# Patient Record
Sex: Female | Born: 1960
Health system: Southern US, Community
[De-identification: ages and names within clinical notes are randomized; demographics above are authoritative.]

## PROBLEM LIST (undated history)

## (undated) DIAGNOSIS — IMO0002 Reserved for concepts with insufficient information to code with codable children: Secondary | ICD-10-CM

## (undated) HISTORY — DX: Reserved for concepts with insufficient information to code with codable children: IMO0002

## (undated) HISTORY — PX: AUGMENTATION MAMMAPLASTY: SUR837

---

## 1982-04-19 HISTORY — PX: OTHER SURGICAL HISTORY: SHX169

## 1986-04-19 HISTORY — PX: CORNEAL TRANSPLANT: SHX108

## 1992-04-19 HISTORY — PX: BREAST ENHANCEMENT SURGERY: SHX7

## 1992-04-19 HISTORY — PX: OTHER SURGICAL HISTORY: SHX169

## 2011-02-08 ENCOUNTER — Ambulatory Visit (HOSPITAL_BASED_OUTPATIENT_CLINIC_OR_DEPARTMENT_OTHER)
Admission: RE | Admit: 2011-02-08 | Discharge: 2011-02-08 | Disposition: A | Discharge status: Home / Self Care | Payer: 59 | Source: Ambulatory Visit | Attending: Internal Medicine | Admitting: Internal Medicine

## 2011-02-08 ENCOUNTER — Encounter: Payer: Self-pay | Admitting: Internal Medicine

## 2011-02-08 ENCOUNTER — Ambulatory Visit (INDEPENDENT_AMBULATORY_CARE_PROVIDER_SITE_OTHER)
Admission: RE | Admit: 2011-02-08 | Discharge: 2011-02-08 | Disposition: A | Discharge status: Home / Self Care | Payer: 59 | Source: Ambulatory Visit | Attending: Internal Medicine | Admitting: Internal Medicine

## 2011-02-08 ENCOUNTER — Ambulatory Visit (INDEPENDENT_AMBULATORY_CARE_PROVIDER_SITE_OTHER): Payer: 59 | Admitting: Internal Medicine

## 2011-02-08 DIAGNOSIS — H52209 Unspecified astigmatism, unspecified eye: Secondary | ICD-10-CM | POA: Insufficient documentation

## 2011-02-08 DIAGNOSIS — N83209 Unspecified ovarian cyst, unspecified side: Secondary | ICD-10-CM | POA: Insufficient documentation

## 2011-02-08 DIAGNOSIS — N949 Unspecified condition associated with female genital organs and menstrual cycle: Secondary | ICD-10-CM

## 2011-02-08 DIAGNOSIS — N920 Excessive and frequent menstruation with regular cycle: Secondary | ICD-10-CM

## 2011-02-08 DIAGNOSIS — Z139 Encounter for screening, unspecified: Secondary | ICD-10-CM

## 2011-02-08 DIAGNOSIS — D259 Leiomyoma of uterus, unspecified: Secondary | ICD-10-CM

## 2011-02-08 DIAGNOSIS — N938 Other specified abnormal uterine and vaginal bleeding: Secondary | ICD-10-CM | POA: Insufficient documentation

## 2011-02-08 DIAGNOSIS — N924 Excessive bleeding in the premenopausal period: Secondary | ICD-10-CM

## 2011-02-08 LAB — CBC WITH DIFFERENTIAL/PLATELET
Basophils Absolute: 0.1 10*3/uL (ref 0.0–0.1)
Eosinophils Relative: 2 % (ref 0–5)
HCT: 36.5 % (ref 36.0–46.0)
Hemoglobin: 10.9 g/dL — ABNORMAL LOW (ref 12.0–15.0)
Lymphocytes Relative: 29 % (ref 12–46)
Lymphs Abs: 1.8 10*3/uL (ref 0.7–4.0)
MCV: 81.5 fL (ref 78.0–100.0)
Monocytes Absolute: 0.3 10*3/uL (ref 0.1–1.0)
Monocytes Relative: 5 % (ref 3–12)
Neutro Abs: 4 10*3/uL (ref 1.7–7.7)
RBC: 4.48 MIL/uL (ref 3.87–5.11)
RDW: 16.2 % — ABNORMAL HIGH (ref 11.5–15.5)
WBC: 6.3 10*3/uL (ref 4.0–10.5)

## 2011-02-08 LAB — TSH: TSH: 2.596 u[IU]/mL (ref 0.350–4.500)

## 2011-02-08 NOTE — Patient Instructions (Signed)
To have ultrasound pelvic and transvaginal today  Labs will be mailed to you  I will call when I have results

## 2011-02-08 NOTE — Progress Notes (Signed)
Subjective:    Patient ID: Joanne Jones, female    DOB: Mar 19, 1961, 50 y.o.   MRN: [id number]  HPI  Joanne Jones is here as a new pt for first visit.  Former care Dr. Primitivo Gauze with [facility].  PMH of abnormal pap with positive HPV and pt reports "Cyst on cervix"  She had cryosurgery over 10 years ago and all pap smears have been normal since that time.  She also states in the distant past she was anemic.  She is overdue for her mammogram and is about 6 months overdue for a pap smear but she has some menstrual bleeding today  Joanne Jones reports she had had heavy menstrual bleeding with clotting  for the first few days of her menstrual cycle requiring her to change hourly on the first few days.  LMP 4 weeks ago but also began bleeding earlier.  She has not missed a cycle.   Rare night sweats that she does not attribute to vasomotor flushing.  No Known Allergies Past Medical History  Diagnosis Date  . 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 Astigmatism     S/P bialteral corneal transplants   Past Surgical History  Procedure Date  . Corneal transp;lant 84    left eye  . Corneal transplant 88    right eye  . Breast enhancement surgery 94  . Removal of lymphoma 2002   History   Social History  . Marital Status: Married    Spouse Name: N/A    Number of Children: N/A  . Years of Education: N/A   Occupational History  . Not on file.   Social History Main Topics  . Smoking status: Never Smoker   . Smokeless tobacco: Never Used  . Alcohol Use: 1.2 oz/week    2 Glasses of wine per week  . Drug Use: No  . Sexually Active: Yes -- Female partner(s)   Other Topics Concern  . Not on file   Social History Narrative  . No narrative on file   Family History  Problem Relation Age of Onset  . Cancer Maternal Grandmother     uterine  . Supraventricular tachycardia Mother   . Heart disease Father    Patient Active Problem List  Diagnoses  . Abnormal Pap  smear and cervical HPV (human papillomavirus)  . Astigmatism   No current outpatient prescriptions on file prior to visit.        Review of Systems    See HPI Objective:   Physical Exam  No Known Allergies Past Medical History  Diagnosis Date  . 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 Astigmatism     S/P bialteral corneal transplants   Past Surgical History  Procedure Date  . Corneal transp;lant 84    left eye  . Corneal transplant 88    right eye  . Breast enhancement surgery 94  . Removal of lymphoma 2002   History   Social History  . Marital Status: Married    Spouse Name: N/A    Number of Children: N/A  . Years of Education: N/A   Occupational History  . Not on file.   Social History Main Topics  . Smoking status: Never Smoker   . Smokeless tobacco: Never Used  . Alcohol Use: 1.2 oz/week    2 Glasses of wine per week  . Drug Use: No  . Sexually Active: Yes --  Female partner(s)   Other Topics Concern  . Not on file   Social History Narrative  . No narrative on file   Family History  Problem Relation Age of Onset  . Cancer Maternal Grandmother     uterine  . Supraventricular tachycardia Mother   . Heart disease Father    Patient Active Problem List  Diagnoses  . Abnormal Pap smear and cervical HPV (human papillomavirus)  . Astigmatism   No current outpatient prescriptions on file prior to visit.    Physical Exam  Nursing note and vitals reviewed.  Constitutional: She is oriented to person, place, and time. She appears well-developed and well-nourished.  HENT:  Head: Normocephalic and atraumatic. Neck No thyromegaly or nodules palpated Cardiovascular: Normal rate and regular rhythm. Exam reveals no gallop and no friction rub.  No murmur heard.  Pulmonary/Chest: Breath sounds normal. She has no wheezes. She has no rales.  Neurological: She is alert and oriented to person, place, and time.  Skin:  Skin is warm and dry.  Psychiatric: She has a normal mood and affect. Her behavior is normal.  Ext no edema Did not do pap/pelvic today as pt has menstrual bleeding       Assessment & Plan:  1)  Menorrhagia:  Suspect anovulatory cycles but will check TSH, CBC, chemistries and will obtain pelvic and TVUS today.  Further therapy based on results 2)  H/o anemia  See above 3)  H/O abnormal pap S/P cryosurgery 4)  Bilateral corneal transplants with astigmatism.  She wishes to follow up with opthalmologist is [place]  Will also get screening mammogram today

## 2011-02-09 ENCOUNTER — Telehealth: Payer: Self-pay | Admitting: Internal Medicine

## 2011-02-09 LAB — COMPREHENSIVE METABOLIC PANEL
AST: 18 U/L (ref 0–37)
Albumin: 4.8 g/dL (ref 3.5–5.2)
BUN: 14 mg/dL (ref 6–23)
CO2: 28 mEq/L (ref 19–32)
Calcium: 10.3 mg/dL (ref 8.4–10.5)
Chloride: 106 mEq/L (ref 96–112)
Creat: 0.79 mg/dL (ref 0.50–1.10)
Potassium: 4.2 mEq/L (ref 3.5–5.3)

## 2011-02-09 LAB — LIPID PANEL
LDL Cholesterol: 131 mg/dL — ABNORMAL HIGH (ref 0–99)
Triglycerides: 79 mg/dL (ref <150)

## 2011-02-09 NOTE — Telephone Encounter (Signed)
Spoke with pt and informed of anemia.  Will give INtegra samples to take one daily or if cannot tolerate daily take qod  Hass 5 mm endometrium,  Will refer to Dr. Jearld Lesch .  Pt voices understanding

## 2011-02-09 NOTE — Telephone Encounter (Signed)
Samples of Integra at front desk for Michelina to pick up.  Appointment scheduled with Dr. Penne Lash 02/17/11 @ 1045/ 1030 arrival- card placed with samples, will give to pt on pick up.  Labs printed and placed with samples and DASH diet

## 2011-02-17 ENCOUNTER — Encounter: Payer: Self-pay | Admitting: Obstetrics & Gynecology

## 2011-02-17 ENCOUNTER — Ambulatory Visit (INDEPENDENT_AMBULATORY_CARE_PROVIDER_SITE_OTHER): Payer: 59 | Admitting: Obstetrics & Gynecology

## 2011-02-17 VITALS — BP 136/78 | HR 65 | Resp 16 | Ht 64.0 in | Wt 130.0 lb

## 2011-02-17 DIAGNOSIS — N840 Polyp of corpus uteri: Secondary | ICD-10-CM

## 2011-02-17 DIAGNOSIS — N926 Irregular menstruation, unspecified: Secondary | ICD-10-CM

## 2011-02-17 DIAGNOSIS — N939 Abnormal uterine and vaginal bleeding, unspecified: Secondary | ICD-10-CM

## 2011-02-17 NOTE — Progress Notes (Signed)
  Subjective:    Patient ID: Joanne Jones, female    DOB: 05/05/1960, 50 y.o.   MRN: [id number]  HPI  50 yo P2 female c/o bleeding 2x month.  Menses lasting up to 9 days then stopping for 4-5 days then restarts bleeding.  Pt fam med hx significant for grandmother with endometrial cancer.  No pelvic pain, urinary or GI complaints.  This increased bleeding has been occurring for 1-1 1/2 yrs.  Review of Systems  Constitutional: Negative.   Respiratory: Negative.   Cardiovascular: Negative.   Gastrointestinal: Negative.   Genitourinary: Positive for vaginal bleeding.  Musculoskeletal: Negative.   Neurological: Negative.   Psychiatric/Behavioral: Negative.        Objective:   Physical Exam  Constitutional: She is oriented to person, place, and time. She appears well-developed and well-nourished.  HENT:  Head: Normocephalic and atraumatic.  Eyes: Conjunctivae are normal.  Pulmonary/Chest: Effort normal.  Abdominal: Soft. She exhibits no distension. There is no tenderness.  Genitourinary: Vagina normal and uterus normal.       Endometrial polyp seen and removed  Neurological: She is alert and oriented to person, place, and time.  Skin: Skin is warm and dry.  Psychiatric: She has a normal mood and affect.    UPT negative Patient given informed consent, signed copy in the chart, time out was performed. Appropriate time out taken. . The patient was placed in the lithotomy position and the cervix brought into view with sterile speculum.  Portio of cervix cleansed x 2 with betadine swabs.  Endometrial polyp seen and grasped.  Removed with twisting.  Good hemostasis.  The uterus was sounded for depth of 9. A pipelle was introduced to into the uterus, suction created,  and an endometrial sample was obtained. All equipment was removed and accounted for.  The patient tolerated the procedure well.    Patient given post procedure instructions. The patient will return in 2 weeks for results.        Assessment & Plan:  50 yo P2 female with irregular uterine bleeding.  1-endometrial polyp estruding from os--removed today 2-Us nml except fluid in endometrial canel; endometrial biopsy today 3-Needs pap smear 4-RTC in 2 weeks.

## 2011-02-17 NOTE — Patient Instructions (Signed)
Endometrial Biopsy This is a test in which a tissue sample (a biopsy) is taken from inside the uterus (womb). It is then looked at by a specialist under a microscope to see if the tissue is normal or abnormal. The endometrium is the lining of the uterus. This test helps determine where you are in your menstrual cycle and how hormone levels are affecting the lining of the uterus. Another use for this test is to diagnose endometrial cancer, tuberculosis, polyps, or inflammatory conditions and to evaluate uterine bleeding. PREPARATION FOR TEST No preparation or fasting is necessary. NORMAL FINDINGS No pathologic conditions. Presence of "secretory-type" endometrium 3 to 5 days before to normal menstruation. Ranges for normal findings may vary among different laboratories and hospitals. You should always check with your doctor after having lab work or other tests done to discuss the meaning of your test results and whether your values are considered within normal limits. MEANING OF TEST  Your caregiver will go over the test results with you and discuss the importance and meaning of your results, as well as treatment options and the need for additional tests if necessary. OBTAINING THE TEST RESULTS It is your responsibility to obtain your test results. Ask the lab or department performing the test when and how you will get your results. Document Released: 08/06/2004 Document Revised: 12/16/2010 Document Reviewed: 03/15/2008 [employer] Patient Information 2012 [employer]. 

## 2011-02-22 ENCOUNTER — Telehealth: Payer: Self-pay | Admitting: *Deleted

## 2011-02-22 NOTE — Telephone Encounter (Signed)
Pt returned call and given results of endometrial biopsy.  Appt scheduled with Dr Penne Lash on 03/03/11 @ 8:45

## 2011-03-03 ENCOUNTER — Encounter: Payer: Self-pay | Admitting: Obstetrics & Gynecology

## 2011-03-03 ENCOUNTER — Ambulatory Visit (INDEPENDENT_AMBULATORY_CARE_PROVIDER_SITE_OTHER): Payer: 59 | Admitting: Obstetrics & Gynecology

## 2011-03-03 DIAGNOSIS — Z01419 Encounter for gynecological examination (general) (routine) without abnormal findings: Secondary | ICD-10-CM

## 2011-03-03 DIAGNOSIS — Z1272 Encounter for screening for malignant neoplasm of vagina: Secondary | ICD-10-CM

## 2011-03-03 DIAGNOSIS — Z113 Encounter for screening for infections with a predominantly sexual mode of transmission: Secondary | ICD-10-CM

## 2011-03-03 DIAGNOSIS — N840 Polyp of corpus uteri: Secondary | ICD-10-CM

## 2011-03-03 NOTE — Patient Instructions (Signed)
Endometrial Ablation Endometrial ablation removes the lining of the uterus (endometrium). It is usually a same day, outpatient treatment. Ablation helps avoid major surgery (such as a hysterectomy). A hysterectomy is removal of the cervix and uterus. Endometrial ablation has less risk and complications, has a shorter recovery period and is less expensive. After endometrial ablation, most women will have little or no menstrual bleeding. You may not keep your fertility. Pregnancy is no longer likely after this procedure but if you are pre-menopausal, you still need to use a reliable method of birth control following the procedure because pregnancy can occur. REASONS TO HAVE THE PROCEDURE MAY INCLUDE:  Heavy periods.   Bleeding that is causing anemia.   Anovulatory bleeding, very irregular, bleeding.   Bleeding submucous fibroids (on the lining inside the uterus) if they are smaller than 3 centimeters.  REASONS NOT TO HAVE THE PROCEDURE MAY INCLUDE:  You wish to have more children.   You have a pre-cancerous or cancerous problem. The cause of any abnormal bleeding must be diagnosed before having the procedure.   You have pain coming from the uterus.   You have a submucus fibroid larger than 3 centimeters.   You recently had a baby.   You recently had an infection in the uterus.   You have a severe retro-flexed, tipped uterus and cannot insert the instrument to do the ablation.   You had a Cesarean section or deep major surgery on the uterus.   The inner cavity of the uterus is too large for the endometrial ablation instrument.  RISKS AND COMPLICATIONS   Perforation of the uterus.   Bleeding.   Infection of the uterus, bladder or vagina.   Injury to surrounding organs.   Cutting the cervix.   An air bubble to the lung (air embolus).   Pregnancy following the procedure.   Failure of the procedure to help the problem requiring hysterectomy.   Decreased ability to diagnose  cancer in the lining of the uterus.  BEFORE THE PROCEDURE  The lining of the uterus must be tested to make sure there is no pre-cancerous or cancer cells present.   Medications may be given to make the lining of the uterus thinner.   Ultrasound may be used to evaluate the size and look for abnormalities of the uterus.   Future pregnancy is not desired.  PROCEDURE  There are different ways to destroy the lining of the uterus.   Resectoscope - radio frequency-alternating electric current is the most common one used.   Cryotherapy - freezing the lining of the uterus.   Heated Free Liquid - heated salt (saline) solution inserted into the uterus.   Microwave - uses high energy microwaves in the uterus.   Thermal Balloon - a catheter with a balloon tip is inserted into the uterus and filled with heated fluid.  Your caregiver will talk with you about the method used in this clinic. They will also instruct you on the pros and cons of the procedure. Endometrial ablation is performed along with a procedure called operative hysteroscopy. A narrow viewing tube is inserted through the birth canal (vagina) and through the cervix into the uterus. A tiny camera attached to the viewing tube (hysteroscope) allows the uterine cavity to be shown on a TV monitor during surgery. Your uterus is filled with a harmless liquid to make the procedure easier. The lining of the uterus is then removed. The lining can also be removed with a resectoscope which allows your surgeon   to cut away the lining of the uterus under direct vision. Usually, you will be able to go home within an hour after the procedure. HOME CARE INSTRUCTIONS   Do not drive for 24 hours.   No tampons, douching or intercourse for 2 weeks or until your caregiver approves.   Rest at home for 24 to 48 hours. You may then resume normal activities unless told differently by your caregiver.   Take your temperature two times a day for 4 days, and record  it.   Take any medications your caregiver has ordered, as directed.   Use some form of contraception if you are pre-menopausal and do not want to get pregnant.  Bleeding after the procedure is normal. It varies from light spotting and mildly watery to bloody discharge for 4 to 6 weeks. You may also have mild cramping. Only take over-the-counter or prescription medicines for pain, discomfort, or fever as directed by your caregiver. Do not use aspirin, as this may aggravate bleeding. Frequent urination during the first 24 hours is normal. You will not know how effective your surgery is until at least 3 months after the surgery. SEEK IMMEDIATE MEDICAL CARE IF:   Bleeding is heavier than a normal menstrual cycle.   An oral temperature above 102 F (38.9 C) develops.   You have increasing cramps or pains not relieved with medication or develop belly (abdominal) pain which does not seem to be related to the same area of earlier cramping and pain.   You are light headed, weak or have fainting episodes.   You develop pain in the shoulder strap areas.   You have chest or leg pain.   You have abnormal vaginal discharge.   You have painful urination.  Document Released: 02/13/2004 Document Revised: 12/16/2010 Document Reviewed: 05/13/2007 [facility] Patient Information 2012 [place], [place].Endometrial Ablation Endometrial ablation removes the lining of the uterus (endometrium). It is usually a same day, outpatient treatment. Ablation helps avoid major surgery (such as a hysterectomy). A hysterectomy is removal of the cervix and uterus. Endometrial ablation has less risk and complications, has a shorter recovery period and is less expensive. After endometrial ablation, most women will have little or no menstrual bleeding. You may not keep your fertility. Pregnancy is no longer likely after this procedure but if you are pre-menopausal, you still need to use a reliable method of birth control following the  procedure because pregnancy can occur. REASONS TO HAVE THE PROCEDURE MAY INCLUDE:  Heavy periods.   Bleeding that is causing anemia.   Anovulatory bleeding, very irregular, bleeding.   Bleeding submucous fibroids (on the lining inside the uterus) if they are smaller than 3 centimeters.  REASONS NOT TO HAVE THE PROCEDURE MAY INCLUDE:  You wish to have more children.   You have a pre-cancerous or cancerous problem. The cause of any abnormal bleeding must be diagnosed before having the procedure.   You have pain coming from the uterus.   You have a submucus fibroid larger than 3 centimeters.   You recently had a baby.   You recently had an infection in the uterus.   You have a severe retro-flexed, tipped uterus and cannot insert the instrument to do the ablation.   You had a Cesarean section or deep major surgery on the uterus.   The inner cavity of the uterus is too large for the endometrial ablation instrument.  RISKS AND COMPLICATIONS   Perforation of the uterus.   Bleeding.   Infection of the  uterus, bladder or vagina.   Injury to surrounding organs.   Cutting the cervix.   An air bubble to the lung (air embolus).   Pregnancy following the procedure.   Failure of the procedure to help the problem requiring hysterectomy.   Decreased ability to diagnose cancer in the lining of the uterus.  BEFORE THE PROCEDURE  The lining of the uterus must be tested to make sure there is no pre-cancerous or cancer cells present.   Medications may be given to make the lining of the uterus thinner.   Ultrasound may be used to evaluate the size and look for abnormalities of the uterus.   Future pregnancy is not desired.  PROCEDURE  There are different ways to destroy the lining of the uterus.   Resectoscope - radio frequency-alternating electric current is the most common one used.   Cryotherapy - freezing the lining of the uterus.   Heated Free Liquid - heated salt  (saline) solution inserted into the uterus.   Microwave - uses high energy microwaves in the uterus.   Thermal Balloon - a catheter with a balloon tip is inserted into the uterus and filled with heated fluid.  Your caregiver will talk with you about the method used in this clinic. They will also instruct you on the pros and cons of the procedure. Endometrial ablation is performed along with a procedure called operative hysteroscopy. A narrow viewing tube is inserted through the birth canal (vagina) and through the cervix into the uterus. A tiny camera attached to the viewing tube (hysteroscope) allows the uterine cavity to be shown on a TV monitor during surgery. Your uterus is filled with a harmless liquid to make the procedure easier. The lining of the uterus is then removed. The lining can also be removed with a resectoscope which allows your surgeon to cut away the lining of the uterus under direct vision. Usually, you will be able to go home within an hour after the procedure. HOME CARE INSTRUCTIONS   Do not drive for 24 hours.   No tampons, douching or intercourse for 2 weeks or until your caregiver approves.   Rest at home for 24 to 48 hours. You may then resume normal activities unless told differently by your caregiver.   Take your temperature two times a day for 4 days, and record it.   Take any medications your caregiver has ordered, as directed.   Use some form of contraception if you are pre-menopausal and do not want to get pregnant.  Bleeding after the procedure is normal. It varies from light spotting and mildly watery to bloody discharge for 4 to 6 weeks. You may also have mild cramping. Only take over-the-counter or prescription medicines for pain, discomfort, or fever as directed by your caregiver. Do not use aspirin, as this may aggravate bleeding. Frequent urination during the first 24 hours is normal. You will not know how effective your surgery is until at least 3 months  after the surgery. SEEK IMMEDIATE MEDICAL CARE IF:   Bleeding is heavier than a normal menstrual cycle.   An oral temperature above 102 F (38.9 C) develops.   You have increasing cramps or pains not relieved with medication or develop belly (abdominal) pain which does not seem to be related to the same area of earlier cramping and pain.   You are light headed, weak or have fainting episodes.   You develop pain in the shoulder strap areas.   You have chest or leg  pain.   You have abnormal vaginal discharge.   You have painful urination.  Document Released: 02/13/2004 Document Revised: 12/16/2010 Document Reviewed: 05/13/2007 [facility] Patient Information 2012 [place], [place].

## 2011-03-03 NOTE — Progress Notes (Signed)
  Subjective:    Patient ID: Joanne Jones, female    DOB: Aug 15, 1960, 50 y.o.   MRN: [id number]  HPI  Pt presents for follow up of biopsies.  Endocervcial polyp benign--pt states bleeding stopped since removal.  Endometrial polyp on biopsy--discussed removal of polyp and ablation for menorrhagia.  Review of Systems  Respiratory: Negative.   Cardiovascular: Negative.   Genitourinary: Negative.        Objective:   Physical Exam  Constitutional: She is oriented to person, place, and time. She appears well-developed and well-nourished. No distress.  HENT:  Head: Normocephalic and atraumatic.  Eyes: Conjunctivae are normal.  Abdominal: Soft. She exhibits no distension. There is no tenderness.  Genitourinary: Vagina normal and uterus normal.  Neurological: She is alert and oriented to person, place, and time.  Skin: Skin is warm and dry.  Psychiatric: She has a normal mood and affect.          Assessment & Plan:  50 yo female with endometrial polyp on biopsy  1-hysteroscopic removal of polyp +/- ablation; pt wants to think about the procedure for now 2-pap done today 3-Pt to call for appt early next year when she is ready for surgery.

## 2011-03-17 ENCOUNTER — Other Ambulatory Visit (INDEPENDENT_AMBULATORY_CARE_PROVIDER_SITE_OTHER): Payer: 59 | Admitting: Emergency Medicine

## 2011-03-17 ENCOUNTER — Telehealth: Payer: Self-pay | Admitting: *Deleted

## 2011-03-17 ENCOUNTER — Telehealth: Payer: Self-pay | Admitting: Internal Medicine

## 2011-03-17 DIAGNOSIS — N390 Urinary tract infection, site not specified: Secondary | ICD-10-CM

## 2011-03-17 DIAGNOSIS — R300 Dysuria: Secondary | ICD-10-CM

## 2011-03-17 LAB — POCT URINALYSIS DIPSTICK
Bilirubin, UA: NEGATIVE
Glucose, UA: NEGATIVE
Ketones, UA: NEGATIVE
Spec Grav, UA: <=1.005
Urobilinogen, UA: NEGATIVE

## 2011-03-17 MED ORDER — CIPROFLOXACIN HCL 500 MG PO TABS: 500.0000 mg | ORAL_TABLET | Freq: Two times a day (BID) | ORAL | Status: AC

## 2011-03-17 NOTE — Telephone Encounter (Signed)
Pt notified of Ascus pap with neg HPV no further testing needed will just repeat pap in 1 year.

## 2011-03-17 NOTE — Telephone Encounter (Signed)
Joanne Jones aware abx called to pharmacy

## 2011-03-17 NOTE — Telephone Encounter (Signed)
See U/A  consistant with UTI  Will treat with 5 day course of Cipro bid   [name] Please call [name] and let her know I will treat her for 5 days.  Call if not better

## 2011-03-19 LAB — CULTURE, URINE COMPREHENSIVE: Colony Count: 45000

## 2011-06-22 ENCOUNTER — Other Ambulatory Visit (HOSPITAL_COMMUNITY): Payer: Self-pay | Admitting: Psychiatry

## 2012-04-19 LAB — HM COLONOSCOPY

## 2012-10-24 ENCOUNTER — Other Ambulatory Visit: Payer: Self-pay | Admitting: *Deleted

## 2012-10-24 DIAGNOSIS — Z139 Encounter for screening, unspecified: Secondary | ICD-10-CM

## 2012-10-24 DIAGNOSIS — Z1231 Encounter for screening mammogram for malignant neoplasm of breast: Secondary | ICD-10-CM

## 2012-11-24 ENCOUNTER — Ambulatory Visit
Admission: RE | Admit: 2012-11-24 | Discharge: 2012-11-24 | Disposition: A | Discharge status: Home / Self Care | Payer: 59 | Source: Ambulatory Visit | Attending: Internal Medicine | Admitting: Internal Medicine

## 2012-11-24 DIAGNOSIS — Z1231 Encounter for screening mammogram for malignant neoplasm of breast: Secondary | ICD-10-CM

## 2012-11-28 ENCOUNTER — Other Ambulatory Visit: Payer: Self-pay | Admitting: *Deleted

## 2012-11-28 DIAGNOSIS — Z139 Encounter for screening, unspecified: Secondary | ICD-10-CM

## 2012-12-04 ENCOUNTER — Other Ambulatory Visit: Payer: Self-pay | Admitting: *Deleted

## 2012-12-04 DIAGNOSIS — Z139 Encounter for screening, unspecified: Secondary | ICD-10-CM

## 2012-12-04 DIAGNOSIS — N926 Irregular menstruation, unspecified: Secondary | ICD-10-CM

## 2013-01-25 ENCOUNTER — Encounter: Payer: 59 | Admitting: Internal Medicine

## 2013-01-29 LAB — CBC WITH DIFFERENTIAL/PLATELET
Eosinophils Absolute: 0.1 10*3/uL (ref 0.0–0.7)
Lymphs Abs: 2.2 10*3/uL (ref 0.7–4.0)
MCH: 28.5 pg (ref 26.0–34.0)
Neutro Abs: 3.9 10*3/uL (ref 1.7–7.7)
Neutrophils Relative %: 58 % (ref 43–77)
Platelets: 271 10*3/uL (ref 150–400)
RBC: 4.35 MIL/uL (ref 3.87–5.11)
WBC: 6.6 10*3/uL (ref 4.0–10.5)

## 2013-01-29 LAB — COMPREHENSIVE METABOLIC PANEL
ALT: 15 U/L (ref 0–35)
Alkaline Phosphatase: 57 U/L (ref 39–117)
CO2: 30 mEq/L (ref 19–32)
Sodium: 139 mEq/L (ref 135–145)
Total Bilirubin: 0.9 mg/dL (ref 0.3–1.2)
Total Protein: 6.7 g/dL (ref 6.0–8.3)

## 2013-01-29 LAB — LIPID PANEL
HDL: 65 mg/dL (ref >39)
LDL Cholesterol: 109 mg/dL — ABNORMAL HIGH (ref 0–99)
Total CHOL/HDL Ratio: 2.9 Ratio

## 2013-01-30 LAB — VITAMIN D 25 HYDROXY (VIT D DEFICIENCY, FRACTURES): Vit D, 25-Hydroxy: 62 ng/mL (ref 30–89)

## 2013-02-05 ENCOUNTER — Encounter: Payer: Self-pay | Admitting: *Deleted

## 2013-02-28 DIAGNOSIS — H18613 Keratoconus, stable, bilateral: Secondary | ICD-10-CM | POA: Insufficient documentation

## 2013-02-28 DIAGNOSIS — H40003 Preglaucoma, unspecified, bilateral: Secondary | ICD-10-CM | POA: Insufficient documentation

## 2013-03-21 ENCOUNTER — Encounter: Payer: Self-pay | Admitting: Internal Medicine

## 2013-03-21 ENCOUNTER — Ambulatory Visit (INDEPENDENT_AMBULATORY_CARE_PROVIDER_SITE_OTHER): Payer: 59 | Admitting: Internal Medicine

## 2013-03-21 VITALS — BP 97/59 | HR 67 | Temp 97.6°F | Resp 18 | Wt 134.0 lb

## 2013-03-21 DIAGNOSIS — R6889 Other general symptoms and signs: Secondary | ICD-10-CM

## 2013-03-21 DIAGNOSIS — Z1151 Encounter for screening for human papillomavirus (HPV): Secondary | ICD-10-CM

## 2013-03-21 DIAGNOSIS — Z947 Corneal transplant status: Secondary | ICD-10-CM

## 2013-03-21 DIAGNOSIS — Z0000 Encounter for general adult medical examination without abnormal findings: Secondary | ICD-10-CM

## 2013-03-21 DIAGNOSIS — Z124 Encounter for screening for malignant neoplasm of cervix: Secondary | ICD-10-CM

## 2013-03-21 LAB — HEMOCCULT GUIAC POC 1CARD (OFFICE): Fecal Occult Blood, POC: NEGATIVE

## 2013-03-21 LAB — POCT URINALYSIS DIPSTICK
Bilirubin, UA: NEGATIVE
Blood, UA: NEGATIVE
Glucose, UA: NEGATIVE
Ketones, UA: NEGATIVE
Leukocytes, UA: NEGATIVE
Nitrite, UA: NEGATIVE
Protein, UA: NEGATIVE
Spec Grav, UA: 1.015
Urobilinogen, UA: NEGATIVE
pH, UA: 7

## 2013-03-21 NOTE — Patient Instructions (Signed)
Will refer to DR Loreta Ave for first screeening colonoscopy  Activate my chart    See me as needed

## 2013-03-21 NOTE — Progress Notes (Signed)
Subjective:    Patient ID: Joanne Jones, female    DOB: 1960/08/08, 52 y.o.   MRN: [id number]  HPI  Joanne Jones is here for CPE.  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   Upon reviewing chart,  She had prior eval for DUB in 2012.  She had removal of endocervical polyp (benign path ) and endometrial polyp (also benign) found with biopsy.  Pt had  Discussion regarding complete excision of endometrial polyp with her GYN at that time and opted to wait .  Her menorrhagia had ceased.    Pap 2012 ASCUS HPV neg.   She has not had a pap since then .  She denies vaginal spotting or pelvic pain.  FH sig for grandmother with endometrial cancer.   MM done with implant views 10/2012 negative.  She is UTD with vacccines.  She has never had a colonoscopy.  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   She has  A mole behind her R shoulder that seems to be enlarging.    Not changing in color, no bleeding or itching    Review of Systems  Respiratory: Negative for choking, chest tightness and shortness of breath.   Cardiovascular: Negative for chest pain, palpitations and leg swelling.  Musculoskeletal: Negative for arthralgias.  All other systems reviewed and are negative.       Objective:   Physical Exam Physical Exam  Vital signs and nursing note reviewed  Constitutional: She is oriented to person, place, and time. She appears well-developed and well-nourished. She is cooperative.  HENT:  Head: Normocephalic and atraumatic.  Right Ear: Tympanic membrane normal.  Left Ear: Tympanic membrane normal.  Nose: Nose normal.  Mouth/Throat: Oropharynx is clear and moist and mucous membranes are normal. No oropharyngeal exudate or posterior oropharyngeal erythema.  Eyes: Conjunctivae and EOM are normal. Pupils are equal, round, and reactive to light.   Unable to visualize fundi well  Neck: Neck supple. No JVD present. Carotid bruit is not present. No mass  and no thyromegaly present.  Cardiovascular: Regular rhythm, normal heart sounds, intact distal pulses and normal pulses.  Exam reveals no gallop and no friction rub.   No murmur heard. Pulses:      Dorsalis pedis pulses are 2+ on the right side, and 2+ on the left side.  Pulmonary/Chest: Breath sounds normal. She has no wheezes. She has no rhonchi. She has no rales. Right breast exhibits no mass, no nipple discharge and no skin change. Left breast exhibits no mass, no nipple discharge and no skin change.  Abdominal: Soft. Bowel sounds are normal. She exhibits no distension and no mass. There is no hepatosplenomegaly. There is no tenderness. There is no CVA tenderness.  Genitourinary: Rectum normal, vagina normal and uterus normal. Rectal exam shows no mass. Guaiac negative stool. No labial fusion. There is no lesion on the right labia. There is no lesion on the left labia. Cervix exhibits no motion tenderness. Right adnexum displays no mass, no tenderness and no fullness. Left adnexum displays no mass, no tenderness and no fullness. No erythema around the vagina.  Musculoskeletal:       No active synovitis to any joint.    Lymphadenopathy:       Right cervical: No superficial cervical adenopathy present.      Left cervical: No superficial cervical adenopathy present.       Right axillary: No pectoral and no lateral adenopathy  present.       Left axillary: No pectoral and no lateral adenopathy present.      Right: No inguinal adenopathy present.       Left: No inguinal adenopathy present.  Neurological: She is alert and oriented to person, place, and time. She has normal strength and normal reflexes. No cranial nerve deficit or sensory deficit. She displays a negative Romberg sign. Coordination and gait normal.  Skin: Skin is warm and dry. No abrasion, no bruising, no ecchymosis and no rash noted. No cyanosis. Nails show no clubbing.  She has brown nevus behind R shoulder  No  discoloration Psychiatric: She has a normal mood and affect. Her speech is normal and behavior is normal.          Assessment & Plan:  Health maintenance  Pap today.  UTD with mm and vaccines  Will refer to GI for colonoscopy.  ASCUS/HPV neg on pap 2012:  Further management based on results  Endometrial polyp  Skin nevus - no worrsome features on exam  Pt wishes to make appt with dermatologist herself.  Will give number to Dr. Luvenia Starch practice  Bialteral corneal transplants

## 2013-03-22 ENCOUNTER — Telehealth: Payer: Self-pay | Admitting: *Deleted

## 2013-03-22 DIAGNOSIS — IMO0002 Reserved for concepts with insufficient information to code with codable children: Secondary | ICD-10-CM | POA: Insufficient documentation

## 2013-03-22 NOTE — Telephone Encounter (Signed)
Left dr Dellia Nims number on VM for pt

## 2013-03-27 ENCOUNTER — Telehealth: Payer: Self-pay | Admitting: Internal Medicine

## 2013-03-27 ENCOUNTER — Encounter: Payer: Self-pay | Admitting: Internal Medicine

## 2013-03-27 NOTE — Telephone Encounter (Signed)
Spoke with pt and informed of ASCUS result.  HPV negative  Will refer back to Dr. Penne Lash who she has seen in the past

## 2013-03-29 ENCOUNTER — Telehealth: Payer: Self-pay | Admitting: Internal Medicine

## 2013-03-29 NOTE — Telephone Encounter (Signed)
Will refer to GYN

## 2013-04-03 ENCOUNTER — Ambulatory Visit: Payer: 59 | Admitting: Obstetrics & Gynecology

## 2013-04-17 ENCOUNTER — Ambulatory Visit (INDEPENDENT_AMBULATORY_CARE_PROVIDER_SITE_OTHER): Payer: 59 | Admitting: Obstetrics & Gynecology

## 2013-04-17 ENCOUNTER — Encounter: Payer: Self-pay | Admitting: Obstetrics & Gynecology

## 2013-04-17 VITALS — BP 98/60 | HR 75 | Resp 16 | Ht 64.0 in | Wt 133.0 lb

## 2013-04-17 DIAGNOSIS — R6889 Other general symptoms and signs: Secondary | ICD-10-CM

## 2013-04-17 DIAGNOSIS — N840 Polyp of corpus uteri: Secondary | ICD-10-CM

## 2013-04-17 NOTE — Progress Notes (Signed)
Pt presents for review of pap smear findings.  Pt had an ASCUS pap smear with HPV negative.  Her next pap smear in 2014 showed ASCUS with HPV negative as well.  Pt is doing through perimenopause and findins could be attributed to atrophy of the tissues.  Per ASCCP guidelines, pt does not ned further work up.  Co teting in 3 years.    Reviewed bleeding profile.  Pt had menstruation in October 2014 and before that was earl 2014.  Pt does not have spotting or heavy bleeding.  Pt had an endocervical polyp removed and had an endometrial biopsy done.  The endocervical polyp was benign and the endometrial polyp showed breakdown.  TV US showed a 5 mm lining in 2012.  WE had talked about ablation but she decided against it.  ?Her bleeding was no longer an issue after removal of the endocervical polyp.    Pt encouraged to intake 1200 mg of calcium a day with Vitamin D.  Discussed atrophic vaginitis and perimenopausal expectations.

## 2013-07-07 ENCOUNTER — Encounter: Payer: Self-pay | Admitting: Internal Medicine

## 2013-07-07 DIAGNOSIS — K635 Polyp of colon: Secondary | ICD-10-CM | POA: Insufficient documentation

## 2013-07-07 DIAGNOSIS — K5790 Diverticulosis of intestine, part unspecified, without perforation or abscess without bleeding: Secondary | ICD-10-CM | POA: Insufficient documentation

## 2013-07-15 ENCOUNTER — Encounter: Payer: Self-pay | Admitting: Internal Medicine

## 2013-07-26 ENCOUNTER — Encounter: Payer: Self-pay | Admitting: *Deleted

## 2014-02-12 ENCOUNTER — Ambulatory Visit: Payer: Self-pay | Admitting: Internal Medicine

## 2014-04-09 ENCOUNTER — Other Ambulatory Visit: Payer: Self-pay | Admitting: *Deleted

## 2014-04-09 DIAGNOSIS — Z0000 Encounter for general adult medical examination without abnormal findings: Secondary | ICD-10-CM

## 2014-04-10 ENCOUNTER — Ambulatory Visit (INDEPENDENT_AMBULATORY_CARE_PROVIDER_SITE_OTHER): Payer: 59 | Admitting: Internal Medicine

## 2014-04-10 ENCOUNTER — Encounter: Payer: Self-pay | Admitting: *Deleted

## 2014-04-10 ENCOUNTER — Other Ambulatory Visit: Payer: Self-pay | Admitting: Internal Medicine

## 2014-04-10 ENCOUNTER — Encounter: Payer: Self-pay | Admitting: Internal Medicine

## 2014-04-10 VITALS — BP 101/62 | HR 72 | Resp 16 | Ht 64.5 in | Wt 139.0 lb

## 2014-04-10 DIAGNOSIS — K635 Polyp of colon: Secondary | ICD-10-CM

## 2014-04-10 DIAGNOSIS — Z0000 Encounter for general adult medical examination without abnormal findings: Secondary | ICD-10-CM

## 2014-04-10 DIAGNOSIS — Z1231 Encounter for screening mammogram for malignant neoplasm of breast: Secondary | ICD-10-CM

## 2014-04-10 DIAGNOSIS — K5730 Diverticulosis of large intestine without perforation or abscess without bleeding: Secondary | ICD-10-CM

## 2014-04-10 DIAGNOSIS — Z1211 Encounter for screening for malignant neoplasm of colon: Secondary | ICD-10-CM

## 2014-04-10 DIAGNOSIS — IMO0002 Reserved for concepts with insufficient information to code with codable children: Secondary | ICD-10-CM

## 2014-04-10 DIAGNOSIS — R896 Abnormal cytological findings in specimens from other organs, systems and tissues: Secondary | ICD-10-CM

## 2014-04-10 DIAGNOSIS — N840 Polyp of corpus uteri: Secondary | ICD-10-CM

## 2014-04-10 LAB — COMPLETE METABOLIC PANEL WITH GFR
ALK PHOS: 46 U/L (ref 39–117)
ALT: 12 U/L (ref 0–35)
AST: 20 U/L (ref 0–37)
Albumin: 4.5 g/dL (ref 3.5–5.2)
BUN: 14 mg/dL (ref 6–23)
CO2: 25 mEq/L (ref 19–32)
CREATININE: 0.85 mg/dL (ref 0.50–1.10)
Calcium: 9.7 mg/dL (ref 8.4–10.5)
Chloride: 104 mEq/L (ref 96–112)
GFR, Est Non African American: 78 mL/min
Glucose, Bld: 86 mg/dL (ref 70–99)
Potassium: 4.4 mEq/L (ref 3.5–5.3)
Sodium: 139 mEq/L (ref 135–145)
TOTAL PROTEIN: 6.6 g/dL (ref 6.0–8.3)
Total Bilirubin: 0.6 mg/dL (ref 0.2–1.2)

## 2014-04-10 LAB — HEMOCCULT GUIAC POC 1CARD (OFFICE)
Card #1 Date: 12232015
FECAL OCCULT BLD: NEGATIVE

## 2014-04-10 LAB — POCT URINALYSIS DIPSTICK
Bilirubin, UA: NEGATIVE
Blood, UA: NEGATIVE
Glucose, UA: NEGATIVE
KETONES UA: NEGATIVE
Leukocytes, UA: NEGATIVE
Nitrite, UA: NEGATIVE
PH UA: 6.5
PROTEIN UA: NEGATIVE
SPEC GRAV UA: 1.02
Urobilinogen, UA: NEGATIVE

## 2014-04-10 LAB — CBC WITH DIFFERENTIAL/PLATELET
Basophils Absolute: 0.1 10*3/uL (ref 0.0–0.1)
Basophils Relative: 1 % (ref 0–1)
Eosinophils Absolute: 0.1 10*3/uL (ref 0.0–0.7)
Eosinophils Relative: 2 % (ref 0–5)
HEMATOCRIT: 38.8 % (ref 36.0–46.0)
HEMOGLOBIN: 12.7 g/dL (ref 12.0–15.0)
LYMPHS ABS: 2.2 10*3/uL (ref 0.7–4.0)
LYMPHS PCT: 34 % (ref 12–46)
MCH: 29.3 pg (ref 26.0–34.0)
MCHC: 32.7 g/dL (ref 30.0–36.0)
MCV: 89.4 fL (ref 78.0–100.0)
MPV: 10.4 fL (ref 9.4–12.4)
Monocytes Absolute: 0.4 10*3/uL (ref 0.1–1.0)
Monocytes Relative: 6 % (ref 3–12)
Neutro Abs: 3.6 10*3/uL (ref 1.7–7.7)
Neutrophils Relative %: 57 % (ref 43–77)
Platelets: 302 10*3/uL (ref 150–400)
RBC: 4.34 MIL/uL (ref 3.87–5.11)
RDW: 14 % (ref 11.5–15.5)
WBC: 6.4 10*3/uL (ref 4.0–10.5)

## 2014-04-10 LAB — LIPID PANEL
CHOL/HDL RATIO: 3.7 ratio
Cholesterol: 211 mg/dL — ABNORMAL HIGH (ref 0–200)
HDL: 57 mg/dL (ref >39)
LDL Cholesterol: 139 mg/dL — ABNORMAL HIGH (ref 0–99)
Triglycerides: 76 mg/dL (ref <150)
VLDL: 15 mg/dL (ref 0–40)

## 2014-04-10 NOTE — Progress Notes (Signed)
Subjective:    Patient ID: Joanne Jones, female    DOB: January 22, 1961, 53 y.o.   MRN: [id number]  HPI GYN note  03/2013 Pt presents for review of pap smear findings. Pt had an ASCUS pap smear with HPV negative. Her next pap smear in 2014 showed ASCUS with HPV negative as well. Pt is doing through perimenopause and findins could be attributed to atrophy of the tissues. Per ASCCP guidelines, pt does not ned further work up. Co teting in 3 years.   Reviewed bleeding profile. Pt had menstruation in October 2014 and before that was earl 2014. Pt does not have spotting or heavy bleeding. Pt had an endocervical polyp removed and had an endometrial biopsy done. The endocervical polyp was benign and the endometrial polyp showed breakdown. TV US showed a 5 mm lining in 2012. WE had talked about ablation but she decided against it. ?Her bleeding was no longer an issue after removal of the endocervical polyp.   Pt encouraged to intake 1200 mg of calcium a day with Vitamin D.  Discussed atrophic vaginitis and perimenopausal expectations.   TODAY  [name] is here for CPE:  Working as Scientist, physiological for [employer]   HM:  She is due for mm,  colonsocopy done 2014,  Non-smoker  Pap done 2014   ASCUS with neg HPV  See Dr. Roseanne Kaufman note  Ok for q3year screening    No Known Allergies Past Medical History  Diagnosis Date  . 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 Astigmatism     S/P bialteral corneal transplants   Past Surgical History  Procedure Laterality Date  . Corneal transp;lant  84    left eye  . Corneal transplant  88    right eye  . Breast enhancement surgery  94  . Removal of lymphoma  2002    incorrect procedure.  . Removal of vulvar lipoma     History   Social History  . Marital Status: Married    Spouse Name: N/A    Number of Children: N/A  . Years of Education: N/A   Occupational History  . Not on file.   Social History Main  Topics  . Smoking status: Never Smoker   . Smokeless tobacco: Never Used  . Alcohol Use: 1.2 oz/week    2 Glasses of wine per week  . Drug Use: No  . Sexual Activity:    Partners: Male   Other Topics Concern  . Not on file   Social History Narrative  . No narrative on file   Family History  Problem Relation Age of Onset  . Cancer Maternal Grandmother     uterine  . Supraventricular tachycardia Mother   . Heart disease Father    Patient Active Problem List   Diagnosis Date Noted  . Colon polyp 07/07/2013  . Diverticulosis 07/07/2013  . ASCUS (atypical squamous cells of undetermined significance) on Pap smear 03/22/2013  . Post corneal transplant 03/21/2013  . Endometrial polyp 03/03/2011  . Astigmatism    Current Outpatient Prescriptions on File Prior to Visit  Medication Sig Dispense Refill  . ibuprofen (ADVIL,MOTRIN) 200 MG tablet Take 200 mg by mouth as needed.      . Multiple Vitamin (MULTIVITAMIN) tablet Take 1 tablet by mouth daily.       No current facility-administered medications on file prior to visit.      Review of Systems See HPI    Objective:  Physical Exam Physical Exam  Nursing note and vitals reviewed.  Constitutional: She is oriented to person, place, and time. She appears well-developed and well-nourished.  HENT:  Head: Normocephalic and atraumatic.  Right Ear: Tympanic membrane and ear canal normal. No drainage. Tympanic membrane is not injected and not erythematous.  Left Ear: Tympanic membrane and ear canal normal. No drainage. Tympanic membrane is not injected and not erythematous.  Nose: Nose normal. Right sinus exhibits no maxillary sinus tenderness and no frontal sinus tenderness. Left sinus exhibits no maxillary sinus tenderness and no frontal sinus tenderness.  Mouth/Throat: Oropharynx is clear and moist. No oral lesions. No oropharyngeal exudate.  Eyes: Conjunctivae and EOM are normal. Pupils are equal, round, and reactive to light.    Neck: Normal range of motion. Neck supple. No JVD present. Carotid bruit is not present. No mass and no thyromegaly present.  Cardiovascular: Normal rate, regular rhythm, S1 normal, S2 normal and intact distal pulses. Exam reveals no gallop and no friction rub.  No murmur heard.  Pulses:  Carotid pulses are 2+ on the right side, and 2+ on the left side.  Dorsalis pedis pulses are 2+ on the right side, and 2+ on the left side.  No carotid bruit. No LE edema  Pulmonary/Chest: Breath sounds normal. She has no wheezes. She has no rales. She exhibits no tenderness.  Breasts bilateral implants no discrete mass no nipple discharge no axillary adenopathy  Abdominal: Soft. Bowel sounds are normal. She exhibits no distension and no mass. There is no hepatosplenomegaly. There is no tenderness. There is no CVA tenderness.   Rectal no mass guaiac neg  No active synovitis to joints.  Lymphadenopathy:  She has no cervical adenopathy.  She has no axillary adenopathy.  Right: No inguinal and no supraclavicular adenopathy present.  Left: No inguinal and no supraclavicular adenopathy present.  Neurological: She is alert and oriented to person, place, and time. She has normal strength and normal reflexes. She displays no tremor. No cranial nerve deficit or sensory deficit. Coordination and gait normal.  Skin: Skin is warm and dry. No rash noted. No cyanosis. Nails show no clubbing.  Psychiatric: She has a normal mood and affect. Her speech is normal and behavior is normal. Cognition and memory are normal.        Assessment & Plan:  HM  Will schedule 3D mm  Pt non smoker  Due for pap in 2017  ASCUS  See GYN note above  Colon polyp  Diverticulosis    See me as needed

## 2014-04-10 NOTE — Patient Instructions (Signed)
See me as needed 

## 2014-04-11 LAB — VITAMIN D 25 HYDROXY (VIT D DEFICIENCY, FRACTURES): Vit D, 25-Hydroxy: 40 ng/mL (ref 30–100)

## 2014-04-11 LAB — TSH: TSH: 2.635 u[IU]/mL (ref 0.350–4.500)

## 2014-04-16 ENCOUNTER — Encounter: Payer: Self-pay | Admitting: Internal Medicine

## 2014-04-22 NOTE — Progress Notes (Signed)
Mailed a copy of labs and DASH diet to patient- eh

## 2014-04-25 ENCOUNTER — Ambulatory Visit
Admission: RE | Admit: 2014-04-25 | Discharge: 2014-04-25 | Disposition: A | Discharge status: Home / Self Care | Payer: 59 | Source: Ambulatory Visit | Attending: Internal Medicine | Admitting: Internal Medicine

## 2014-04-25 DIAGNOSIS — Z1231 Encounter for screening mammogram for malignant neoplasm of breast: Secondary | ICD-10-CM

## 2014-04-28 ENCOUNTER — Encounter: Payer: Self-pay | Admitting: Internal Medicine

## 2014-12-24 DIAGNOSIS — H01009 Unspecified blepharitis unspecified eye, unspecified eyelid: Secondary | ICD-10-CM | POA: Insufficient documentation

## 2014-12-24 DIAGNOSIS — H04121 Dry eye syndrome of right lacrimal gland: Secondary | ICD-10-CM | POA: Insufficient documentation

## 2015-05-22 ENCOUNTER — Telehealth: Payer: Self-pay | Admitting: *Deleted

## 2015-05-22 NOTE — Telephone Encounter (Signed)
Unable to reach patient at time of pre-visit call. Left message for patient to return call when available.  

## 2015-05-23 ENCOUNTER — Ambulatory Visit (INDEPENDENT_AMBULATORY_CARE_PROVIDER_SITE_OTHER): Payer: 59 | Admitting: Family

## 2015-05-23 ENCOUNTER — Encounter: Payer: Self-pay | Admitting: Family

## 2015-05-23 ENCOUNTER — Other Ambulatory Visit: Payer: Self-pay | Admitting: Family

## 2015-05-23 VITALS — BP 104/74 | HR 65 | Temp 97.9°F | Resp 14 | Ht 64.25 in | Wt 138.6 lb

## 2015-05-23 DIAGNOSIS — Z0000 Encounter for general adult medical examination without abnormal findings: Secondary | ICD-10-CM

## 2015-05-23 DIAGNOSIS — Z1231 Encounter for screening mammogram for malignant neoplasm of breast: Secondary | ICD-10-CM

## 2015-05-23 LAB — CBC WITH DIFFERENTIAL/PLATELET
BASOS ABS: 0 10*3/uL (ref 0.0–0.1)
Basophils Relative: 0.5 % (ref 0.0–3.0)
EOS PCT: 1.5 % (ref 0.0–5.0)
Eosinophils Absolute: 0.1 10*3/uL (ref 0.0–0.7)
HEMATOCRIT: 40.1 % (ref 36.0–46.0)
HEMOGLOBIN: 13.3 g/dL (ref 12.0–15.0)
LYMPHS ABS: 2.4 10*3/uL (ref 0.7–4.0)
LYMPHS PCT: 37.1 % (ref 12.0–46.0)
MCHC: 33.2 g/dL (ref 30.0–36.0)
MCV: 88.6 fl (ref 78.0–100.0)
MONOS PCT: 5.7 % (ref 3.0–12.0)
Monocytes Absolute: 0.4 10*3/uL (ref 0.1–1.0)
NEUTROS PCT: 55.2 % (ref 43.0–77.0)
Neutro Abs: 3.5 10*3/uL (ref 1.4–7.7)
Platelets: 292 10*3/uL (ref 150.0–400.0)
RBC: 4.53 Mil/uL (ref 3.87–5.11)
RDW: 13.6 % (ref 11.5–15.5)
WBC: 6.4 10*3/uL (ref 4.0–10.5)

## 2015-05-23 LAB — BASIC METABOLIC PANEL
BUN: 14 mg/dL (ref 6–23)
CALCIUM: 9.8 mg/dL (ref 8.4–10.5)
CO2: 31 mEq/L (ref 19–32)
Chloride: 104 mEq/L (ref 96–112)
Creatinine, Ser: 0.91 mg/dL (ref 0.40–1.20)
GFR: 68.15 mL/min (ref >60.00)
GLUCOSE: 95 mg/dL (ref 70–99)
POTASSIUM: 4.5 meq/L (ref 3.5–5.1)
SODIUM: 140 meq/L (ref 135–145)

## 2015-05-23 LAB — URINALYSIS, ROUTINE W REFLEX MICROSCOPIC
Bilirubin Urine: NEGATIVE
HGB URINE DIPSTICK: NEGATIVE
KETONES UR: NEGATIVE
LEUKOCYTES UA: NEGATIVE
Nitrite: NEGATIVE
RBC / HPF: NONE SEEN (ref 0–?)
TOTAL PROTEIN, URINE-UPE24: NEGATIVE
URINE GLUCOSE: NEGATIVE
Urobilinogen, UA: 0.2 (ref 0.0–1.0)
WBC, UA: NONE SEEN — AB (ref 0–?)
pH: 6.5 (ref 5.0–8.0)

## 2015-05-23 LAB — HEPATIC FUNCTION PANEL
ALBUMIN: 4.6 g/dL (ref 3.5–5.2)
ALK PHOS: 56 U/L (ref 39–117)
ALT: 13 U/L (ref 0–35)
AST: 20 U/L (ref 0–37)
Bilirubin, Direct: 0.1 mg/dL (ref 0.0–0.3)
Total Bilirubin: 0.8 mg/dL (ref 0.2–1.2)
Total Protein: 7.3 g/dL (ref 6.0–8.3)

## 2015-05-23 LAB — LIPID PANEL
CHOLESTEROL: 210 mg/dL — AB (ref 0–200)
HDL: 62.7 mg/dL (ref >39.00)
LDL Cholesterol: 137 mg/dL — ABNORMAL HIGH (ref 0–99)
NONHDL: 147.2
Total CHOL/HDL Ratio: 3
Triglycerides: 49 mg/dL (ref 0.0–149.0)
VLDL: 9.8 mg/dL (ref 0.0–40.0)

## 2015-05-23 LAB — TSH: TSH: 2.18 u[IU]/mL (ref 0.35–4.50)

## 2015-05-23 NOTE — Progress Notes (Signed)
Pre visit review using our clinic review tool, if applicable. No additional management support is needed unless otherwise documented below in the visit note. 

## 2015-05-23 NOTE — Progress Notes (Addendum)
Subjective:    Patient ID: Joanne Jones, female    DOB: 1960/06/28, 55 y.o.   MRN: [id number]  HPI  Joanne Jones is a 55 yr old female who presents today to establish care.  Patient presents today for complete physical.  Immunizations: flu shot up to date, Td 2009 Diet: overall healthy diet. Exercise: elliptical 40 min 4 x a week, Saturday walks 5 miles, some yoga.   Colonoscopy: 1/16 (Dr. Collene Mares), had 1 small polyp was told 10 yr follow up. Dexa: Has never had dexa.  LMP 12/16 Pap Smear:  10/15 Mammogram: due Dental: up to date  Hx abnormal pap. Saw Joanne Jones and was told Pap OK.   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     Review of Systems  Constitutional: Negative for unexpected weight change.  HENT: Negative for hearing loss and rhinorrhea.   Eyes: Negative for visual disturbance.  Respiratory: Negative for cough and shortness of breath.   Cardiovascular: Negative for chest pain and leg swelling.  Gastrointestinal: Negative for diarrhea, constipation and anal bleeding.  Genitourinary: Negative for dysuria and frequency.  Musculoskeletal: Negative for myalgias and arthralgias.  Skin: Negative for rash.  Neurological:       Occasional headaches  Hematological: Negative for adenopathy.  Psychiatric/Behavioral:       Denies depression/anxiety   Past Medical History  Diagnosis Date  . 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 Astigmatism     S/P bialteral corneal transplants    Social History   Social History  . Marital Status: Married    Spouse Name: N/A  . Number of Children: N/A  . Years of Education: N/A   Occupational History  . Not on file.   Social History Main Topics  . Smoking status: Never Smoker   . Smokeless tobacco: Never Used  . Alcohol Use: 8.4 oz/week    14 Glasses of wine per week  . Drug Use: No  . Sexual Activity:    Partners: Male   Other Topics Concern  . 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     Past Surgical History  Procedure Laterality Date  . Corneal transp;lant  84    left eye  . Corneal transplant  88    right eye  . Breast enhancement surgery  94  . Removal of vulvar lipoma  1994    Family History  Problem Relation Age of Onset  . Cancer Maternal Grandmother     uterine  . Supraventricular tachycardia Mother     living  . Heart disease Father 70    hx of MV disease, LAD lesion, died of stroke complication of surgery    No Known Allergies  No current outpatient prescriptions on file prior to visit.   No current facility-administered medications on file prior to visit.    BP 104/74 mmHg  Pulse 65  Temp(Src) 97.9 F (36.6 C) (Oral)  Resp 14  Ht 5' 4.25" (1.632 m)  Wt 138 lb 9.6 oz (62.869 kg)  BMI 23.60 kg/m2  SpO2 100%  LMP 03/20/2015        Objective:   Physical Exam  Physical Exam  Constitutional: She is oriented to person, place,  and time. She appears well-developed and well-nourished. No distress.  HENT:  Head: Normocephalic and atraumatic.  Right Ear: Tympanic membrane and ear canal normal.  Left Ear: Tympanic membrane and ear canal normal.  Mouth/Throat: Oropharynx is clear and moist.  Eyes: Pupils are equal, round, and reactive to light. No scleral icterus.  Neck: Normal range of motion. No thyromegaly present.  Cardiovascular: Normal rate and regular rhythm.   No murmur heard. Pulmonary/Chest: Effort normal and breath sounds normal. No respiratory distress. He has no wheezes. She has no rales. She exhibits no tenderness.  Abdominal: Soft. Bowel sounds are normal. He exhibits no distension and no mass. There is no tenderness. There is no rebound and no guarding.  Musculoskeletal: She exhibits no edema.  Lymphadenopathy:    She has no  cervical adenopathy.  Neurological: She is alert and oriented to person, place, and time. She has normal patellar reflexes. She exhibits normal muscle tone. Coordination normal.  Skin: Skin is warm and dry. left posterior shoulder-benign appearing raised skin keratosis approx 3 mm width Psychiatric: She has a normal mood and affect. Her behavior is normal. Judgment and thought content normal.  Breasts: Examined lying Right: Without masses, retractions, discharge or axillary adenopathy. Breast implant noted Left: Without masses, retractions, discharge or axillary adenopathy. Breast implant noted Deferred          Assessment & Plan:         Assessment & Plan:  EKG tracing is personally reviewed.  EKG notes NSR.  No acute changes.

## 2015-05-23 NOTE — Assessment & Plan Note (Signed)
Continue healthy diet, exercise. Obtain routine labs. Refer for mammogram. She is due for Pap/cotesting in 12/17.

## 2015-05-23 NOTE — Patient Instructions (Addendum)
Please complete lab work prior to leaving. Continue regular exercise and healthy diet. Follow up in December for Pap smear.  Welcome to [facility]!

## 2015-06-12 ENCOUNTER — Ambulatory Visit
Admission: RE | Admit: 2015-06-12 | Discharge: 2015-06-12 | Disposition: A | Discharge status: Home / Self Care | Payer: 59 | Source: Ambulatory Visit | Attending: Family | Admitting: Family

## 2015-06-12 DIAGNOSIS — Z1231 Encounter for screening mammogram for malignant neoplasm of breast: Secondary | ICD-10-CM | POA: Diagnosis not present

## 2015-06-13 ENCOUNTER — Other Ambulatory Visit: Payer: Self-pay | Admitting: Family

## 2015-06-13 DIAGNOSIS — R928 Other abnormal and inconclusive findings on diagnostic imaging of breast: Secondary | ICD-10-CM

## 2015-06-13 DIAGNOSIS — H18603 Keratoconus, unspecified, bilateral: Secondary | ICD-10-CM | POA: Diagnosis not present

## 2015-06-25 MED FILL — FML S.O.P. 0.1% OINTMENT: 0.1 | 14 days supply | Qty: 4 | Fill #0

## 2015-06-26 ENCOUNTER — Ambulatory Visit
Admission: RE | Admit: 2015-06-26 | Discharge: 2015-06-26 | Disposition: A | Discharge status: Home / Self Care | Payer: 59 | Source: Ambulatory Visit | Attending: Family | Admitting: Family

## 2015-06-26 DIAGNOSIS — R928 Other abnormal and inconclusive findings on diagnostic imaging of breast: Secondary | ICD-10-CM | POA: Diagnosis not present

## 2015-06-26 DIAGNOSIS — N6489 Other specified disorders of breast: Secondary | ICD-10-CM | POA: Diagnosis not present

## 2015-07-11 DIAGNOSIS — H18603 Keratoconus, unspecified, bilateral: Secondary | ICD-10-CM | POA: Diagnosis not present

## 2015-07-11 DIAGNOSIS — H18891 Other specified disorders of cornea, right eye: Secondary | ICD-10-CM | POA: Diagnosis not present

## 2016-03-22 ENCOUNTER — Other Ambulatory Visit (HOSPITAL_COMMUNITY)
Admission: RE | Admit: 2016-03-22 | Discharge: 2016-03-22 | Disposition: A | Discharge status: Home / Self Care | Payer: 59 | Source: Ambulatory Visit | Attending: Family | Admitting: Family

## 2016-03-22 ENCOUNTER — Encounter: Payer: Self-pay | Admitting: Family

## 2016-03-22 ENCOUNTER — Ambulatory Visit (INDEPENDENT_AMBULATORY_CARE_PROVIDER_SITE_OTHER): Payer: 59 | Admitting: Family

## 2016-03-22 DIAGNOSIS — Z01419 Encounter for gynecological examination (general) (routine) without abnormal findings: Secondary | ICD-10-CM | POA: Insufficient documentation

## 2016-03-22 DIAGNOSIS — Z1151 Encounter for screening for human papillomavirus (HPV): Secondary | ICD-10-CM | POA: Insufficient documentation

## 2016-03-22 MED ORDER — CALCIUM CARBONATE-VITAMIN D 600-400 MG-UNIT PO CHEW: 1.0000 | CHEWABLE_TABLET | Freq: Two times a day (BID) | ORAL | Status: DC

## 2016-03-22 NOTE — Patient Instructions (Addendum)
Please follow up after 05/22/16 for your annual physical.  Please add a calcium supplement twice daily.

## 2016-03-22 NOTE — Assessment & Plan Note (Signed)
Pap performed with cotesting today. We also discussed bone health and the addition of caltrate 600mg  + D bid.

## 2016-03-22 NOTE — Addendum Note (Signed)
Addended by: Kelle Darting A on: 03/22/2016 12:00 PM   Modules accepted: Orders

## 2016-03-22 NOTE — Progress Notes (Signed)
Subjective:    Patient ID: Joanne Jones, female    DOB: [birth year], 55 y.o.   MRN: [id number]  HPI  [name] is a 55 yr old female who presents today for pap smear.  Reports hx of polyp in cervix as well as some cervical cysts. No longer getting periods.  LMP was 6/17. mammo 06/26/15 Flu shot up to date  Not on calcium supplemetn  [id number]   Review of Systems See HPI  Past Medical History:  Diagnosis Date  . Abnormal Pap smear and cervical HPV (human papillomavirus)    cervical cyst  . Astigmatism    S/P bialteral corneal transplants     Social History   Social History  . Marital status: Married    Spouse name: N/A  . Number of children: N/A  . Years of education: N/A   Occupational History  . Not on file.   Social History Main Topics  . Smoking status: Never Smoker  . Smokeless tobacco: Never Used  . Alcohol use 8.4 oz/week    14 Glasses of wine per week  . Drug use: No  . Sexual activity: Yes    Partners: Male   Other Topics Concern  . 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     Past Surgical History:  Procedure Laterality Date  . BREAST ENHANCEMENT SURGERY  94  . corneal transp;lant  84   left eye  . CORNEAL TRANSPLANT  88   right eye  . removal of vulvar lipoma  1994    Family History  Problem Relation Age of Onset  . Cancer Maternal Grandmother     uterine  . Supraventricular tachycardia Mother     living  . Heart disease Father 92    hx of MV disease, LAD lesion, died of stroke complication of surgery    No Known Allergies  No current outpatient prescriptions on file prior to visit.   No current facility-administered medications on file prior to visit.     BP 113/80 (BP Location: Right Arm, Cuff Size: Normal)   Pulse 72   Temp 97.8 F (36.6 C) (Oral)    Resp 16   Ht 5' 4.25" (1.632 m)   Wt 143 lb 6.4 oz (65 kg)   SpO2 99% Comment: room air  BMI 24.42 kg/m       Objective:   Physical Exam  Constitutional: She is oriented to person, place, and time. She appears well-developed and well-nourished.  Cardiovascular: Normal rate, regular rhythm and normal heart sounds.   No murmur heard. Pulmonary/Chest: Effort normal and breath sounds normal. No respiratory distress. She has no wheezes.  Neurological: She is alert and oriented to person, place, and time.  Psychiatric: She has a normal mood and affect. Her behavior is normal. Judgment and thought content normal.  Breasts: Examined lying and sitting. (bilateral breast implants are noted which limit exam) Right: Without masses, retractions, discharge or axillary adenopathy.  Left: Without masses, retractions, discharge or axillary adenopathy.  Inguinal/mons: Normal without inguinal adenopathy  External genitalia: Normal  BUS/Urethra/Skene's glands: Normal  Bladder: Normal  Vagina: Normal  Cervix: Normal  Uterus: normal in size, shape and contour. Midline and mobile  Adnexa/parametria:  Rt: Without masses or tenderness.  Lt: Without masses  or tenderness.  Anus and perineum: Normal          Assessment & Plan:

## 2016-03-22 NOTE — Progress Notes (Signed)
Pre visit review using our clinic review tool, if applicable. No additional management support is needed unless otherwise documented below in the visit note. 

## 2016-03-23 LAB — CYTOLOGY - PAP
Diagnosis: NEGATIVE
HPV: NOT DETECTED

## 2016-05-20 DIAGNOSIS — H25043 Posterior subcapsular polar age-related cataract, bilateral: Secondary | ICD-10-CM | POA: Diagnosis not present

## 2016-05-20 DIAGNOSIS — Z947 Corneal transplant status: Secondary | ICD-10-CM | POA: Diagnosis not present

## 2016-05-20 DIAGNOSIS — H18613 Keratoconus, stable, bilateral: Secondary | ICD-10-CM | POA: Diagnosis not present

## 2016-05-20 DIAGNOSIS — H52223 Regular astigmatism, bilateral: Secondary | ICD-10-CM | POA: Diagnosis not present

## 2016-05-20 DIAGNOSIS — H2513 Age-related nuclear cataract, bilateral: Secondary | ICD-10-CM | POA: Diagnosis not present

## 2016-06-08 ENCOUNTER — Encounter: Payer: Self-pay | Admitting: Family

## 2016-06-08 ENCOUNTER — Ambulatory Visit (INDEPENDENT_AMBULATORY_CARE_PROVIDER_SITE_OTHER): Payer: 59 | Admitting: Family

## 2016-06-08 VITALS — BP 103/60 | HR 79 | Temp 98.6°F | Resp 16 | Ht 64.5 in | Wt 146.0 lb

## 2016-06-08 DIAGNOSIS — Z0000 Encounter for general adult medical examination without abnormal findings: Secondary | ICD-10-CM

## 2016-06-08 DIAGNOSIS — E2839 Other primary ovarian failure: Secondary | ICD-10-CM

## 2016-06-08 LAB — CBC WITH DIFFERENTIAL/PLATELET
BASOS ABS: 0.1 10*3/uL (ref 0.0–0.1)
Basophils Relative: 1.1 % (ref 0.0–3.0)
EOS PCT: 2 % (ref 0.0–5.0)
Eosinophils Absolute: 0.1 10*3/uL (ref 0.0–0.7)
HCT: 33.8 % — ABNORMAL LOW (ref 36.0–46.0)
Hemoglobin: 11.4 g/dL — ABNORMAL LOW (ref 12.0–15.0)
LYMPHS ABS: 1.9 10*3/uL (ref 0.7–4.0)
Lymphocytes Relative: 37.5 % (ref 12.0–46.0)
MCHC: 33.8 g/dL (ref 30.0–36.0)
MCV: 89.3 fl (ref 78.0–100.0)
Monocytes Absolute: 0.3 10*3/uL (ref 0.1–1.0)
Monocytes Relative: 5.6 % (ref 3.0–12.0)
NEUTROS ABS: 2.7 10*3/uL (ref 1.4–7.7)
NEUTROS PCT: 53.8 % (ref 43.0–77.0)
PLATELETS: 274 10*3/uL (ref 150.0–400.0)
RBC: 3.78 Mil/uL — AB (ref 3.87–5.11)
RDW: 14.4 % (ref 11.5–15.5)
WBC: 5 10*3/uL (ref 4.0–10.5)

## 2016-06-08 LAB — BASIC METABOLIC PANEL
BUN: 17 mg/dL (ref 6–23)
CHLORIDE: 106 meq/L (ref 96–112)
CO2: 30 mEq/L (ref 19–32)
CREATININE: 0.89 mg/dL (ref 0.40–1.20)
Calcium: 9.7 mg/dL (ref 8.4–10.5)
GFR: 69.65 mL/min (ref >60.00)
GLUCOSE: 96 mg/dL (ref 70–99)
POTASSIUM: 4.2 meq/L (ref 3.5–5.1)
Sodium: 140 mEq/L (ref 135–145)

## 2016-06-08 LAB — URINALYSIS, ROUTINE W REFLEX MICROSCOPIC
Bilirubin Urine: NEGATIVE
HGB URINE DIPSTICK: NEGATIVE
Ketones, ur: NEGATIVE
LEUKOCYTES UA: NEGATIVE
NITRITE: NEGATIVE
RBC / HPF: NONE SEEN (ref 0–?)
Total Protein, Urine: NEGATIVE
URINE GLUCOSE: NEGATIVE
Urobilinogen, UA: 0.2 (ref 0.0–1.0)
WBC UA: NONE SEEN (ref 0–?)
pH: 6.5 (ref 5.0–8.0)

## 2016-06-08 LAB — LIPID PANEL
CHOLESTEROL: 215 mg/dL — AB (ref 0–200)
HDL: 60.9 mg/dL (ref >39.00)
LDL CALC: 144 mg/dL — AB (ref 0–99)
NonHDL: 154.45
Total CHOL/HDL Ratio: 4
Triglycerides: 52 mg/dL (ref 0.0–149.0)
VLDL: 10.4 mg/dL (ref 0.0–40.0)

## 2016-06-08 LAB — HEPATIC FUNCTION PANEL
ALT: 18 U/L (ref 0–35)
AST: 23 U/L (ref 0–37)
Albumin: 4.5 g/dL (ref 3.5–5.2)
Alkaline Phosphatase: 47 U/L (ref 39–117)
BILIRUBIN TOTAL: 0.5 mg/dL (ref 0.2–1.2)
Bilirubin, Direct: 0.1 mg/dL (ref 0.0–0.3)
Total Protein: 6.8 g/dL (ref 6.0–8.3)

## 2016-06-08 LAB — TSH: TSH: 3.69 u[IU]/mL (ref 0.35–4.50)

## 2016-06-08 NOTE — Progress Notes (Signed)
Subjective:    Patient ID: Joanne Jones, female    DOB: 1960-07-08, 56 y.o.   MRN: [id number]  HPI  Patient presents today for complete physical.  Immunizations:  Unsure of last tetanus Diet: fair Wt Readings from Last 3 Encounters:  06/08/16 146 lb (66.2 kg)  03/22/16 143 lb 6.4 oz (65 kg)  05/23/15 138 lb 9.6 oz (62.9 kg)  Exercise: reports that she exercises 5+ times a week Colonoscopy: Dr. Collene Mares around 2014- was told 10 year follow up Dexa: LMP was at age 87  Pap Smear: 12/17 Mammogram: due 2/23    Review of Systems  Constitutional: Negative for unexpected weight change.  HENT: Negative for hearing loss.   Eyes:       Seeing a corneal specialist   Respiratory: Negative for cough.   Cardiovascular: Negative for leg swelling.  Gastrointestinal: Negative for constipation and diarrhea.  Genitourinary: Negative for dysuria and frequency.  Musculoskeletal: Negative for arthralgias and myalgias.  Skin: Negative for rash.  Neurological: Negative for headaches.  Hematological: Negative for adenopathy.  Psychiatric/Behavioral:       Denies depression/anxiety   Past Medical History:  Diagnosis Date  . Abnormal Pap smear and cervical HPV (human papillomavirus)    cervical cyst  . Astigmatism    S/P bialteral corneal transplants     Social History   Social History  . Marital status: Married    Spouse name: N/A  . Number of children: N/A  . Years of education: N/A   Occupational History  . Not on file.   Social History Main Topics  . Smoking status: Never Smoker  . Smokeless tobacco: Never Used  . Alcohol use 8.4 oz/week    14 Glasses of wine per week  . Drug use: No  . Sexual activity: Yes    Partners: Male    Birth control/ protection: Implant   Other Topics Concern  . 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     Past Surgical History:  Procedure Laterality Date  . BREAST ENHANCEMENT SURGERY  94  . corneal transp;lant  84   left eye  . CORNEAL TRANSPLANT  88   right eye  . removal of vulvar lipoma  1994    Family History  Problem Relation Age of Onset  . Cancer Maternal Grandmother     uterine  . Supraventricular tachycardia Mother     living  . Heart disease Father 22    hx of MV disease, LAD lesion, died of stroke complication of surgery    No Known Allergies  No current outpatient prescriptions on file prior to visit.   No current facility-administered medications on file prior to visit.     BP 103/60 (BP Location: Right Arm, Cuff Size: Normal)   Pulse 79   Temp 98.6 F (37 C) (Oral)   Resp 16   Ht 5' 4.5" (1.638 m)   Wt 146 lb (66.2 kg)   SpO2 98% Comment: room air  BMI 24.67 kg/m       Objective:   Physical Exam  Physical Exam  Constitutional: She is oriented to person, place, and time. She appears well-developed and well-nourished. No distress.  HENT:  Head: Normocephalic and atraumatic.  Right Ear: Tympanic membrane and  ear canal normal.  Left Ear: Tympanic membrane and ear canal normal.  Mouth/Throat: Oropharynx is clear and moist.  Eyes: Pupils are equal, round, and reactive to light. No scleral icterus.  Neck: Normal range of motion. No thyromegaly present.  Cardiovascular: Normal rate and regular rhythm.   No murmur heard. Pulmonary/Chest: Effort normal and breath sounds normal. No respiratory distress. He has no wheezes. She has no rales. She exhibits no tenderness.  Abdominal: Soft. Bowel sounds are normal. She exhibits no distension and no mass. There is no tenderness. There is no rebound and no guarding.  Musculoskeletal: She exhibits no edema.  Lymphadenopathy:    She has no cervical adenopathy.  Neurological: She is alert and oriented to person, place, and time. She has normal patellar reflexes. She exhibits  normal muscle tone. Coordination normal.  Skin: Skin is warm and dry.  Psychiatric: She has a normal mood and affect. Her behavior is normal. Judgment and thought content normal.  Breasts: Examined lying- bilateral breast implants are noted Right: Without masses, retractions, discharge or axillary adenopathy.  Left: Without masses, retractions, discharge or axillary adenopathy.         Assessment & Plan:        Assessment & Plan:  Preventative Care- EKG tracing is personally reviewed.  EKG notes NSR.  No acute changes. Discussed healthy diet, continuing regular exercise. She will check date of last tetanus shot and let us know.

## 2016-06-08 NOTE — Patient Instructions (Signed)
Please complete lab work prior to leaving. Schedule mammogram and bone density on the first floor.  

## 2016-06-08 NOTE — Progress Notes (Signed)
Pre visit review using our clinic review tool, if applicable. No additional management support is needed unless otherwise documented below in the visit note. 

## 2016-06-14 ENCOUNTER — Telehealth: Payer: Self-pay | Admitting: *Deleted

## 2016-06-14 DIAGNOSIS — D649 Anemia, unspecified: Secondary | ICD-10-CM

## 2016-06-14 NOTE — Telephone Encounter (Signed)
-----   Message from Debbrah Alar, NP sent at 06/10/2016  6:16 PM EST ----- Lets ask lab to add on serum iron, b12 and folate.  She is mildly anemic.  I would also like her to complete and ifob.  Kidney, sugar, thyroid, electrolytes and urine look good. Cholesterol remains mildly elevated.  Please work on low fat/low cholesterol diet.

## 2016-06-14 NOTE — Telephone Encounter (Signed)
Attempted to reach pt and left message to check mychart acct. Message sent. Future orders entered and IFOB kit mailed to pt.

## 2016-06-16 ENCOUNTER — Other Ambulatory Visit (INDEPENDENT_AMBULATORY_CARE_PROVIDER_SITE_OTHER): Payer: 59

## 2016-06-16 ENCOUNTER — Ambulatory Visit (HOSPITAL_BASED_OUTPATIENT_CLINIC_OR_DEPARTMENT_OTHER)
Admission: RE | Admit: 2016-06-16 | Discharge: 2016-06-16 | Disposition: A | Discharge status: Home / Self Care | Payer: 59 | Source: Ambulatory Visit | Attending: Family | Admitting: Family

## 2016-06-16 DIAGNOSIS — D649 Anemia, unspecified: Secondary | ICD-10-CM

## 2016-06-16 DIAGNOSIS — E2839 Other primary ovarian failure: Secondary | ICD-10-CM | POA: Diagnosis not present

## 2016-06-16 DIAGNOSIS — Z780 Asymptomatic menopausal state: Secondary | ICD-10-CM | POA: Diagnosis not present

## 2016-06-16 LAB — FOLATE: FOLATE: 21.4 ng/mL (ref >5.9)

## 2016-06-16 LAB — VITAMIN B12: VITAMIN B 12: 331 pg/mL (ref 211–911)

## 2016-06-16 LAB — IRON: IRON: 57 ug/dL (ref 42–145)

## 2016-06-24 ENCOUNTER — Other Ambulatory Visit (INDEPENDENT_AMBULATORY_CARE_PROVIDER_SITE_OTHER): Payer: 59

## 2016-06-24 DIAGNOSIS — D649 Anemia, unspecified: Secondary | ICD-10-CM | POA: Diagnosis not present

## 2016-06-24 LAB — FECAL OCCULT BLOOD, IMMUNOCHEMICAL: FECAL OCCULT BLD: NEGATIVE

## 2016-07-07 ENCOUNTER — Other Ambulatory Visit: Payer: Self-pay | Admitting: Family

## 2016-07-07 DIAGNOSIS — N6489 Other specified disorders of breast: Secondary | ICD-10-CM

## 2016-09-30 DIAGNOSIS — H018 Other specified inflammations of eyelid: Secondary | ICD-10-CM | POA: Diagnosis not present

## 2017-02-09 DIAGNOSIS — H18613 Keratoconus, stable, bilateral: Secondary | ICD-10-CM | POA: Diagnosis not present

## 2017-02-09 DIAGNOSIS — H40003 Preglaucoma, unspecified, bilateral: Secondary | ICD-10-CM | POA: Diagnosis not present

## 2017-02-09 DIAGNOSIS — H01003 Unspecified blepharitis right eye, unspecified eyelid: Secondary | ICD-10-CM | POA: Diagnosis not present

## 2017-02-09 DIAGNOSIS — H01006 Unspecified blepharitis left eye, unspecified eyelid: Secondary | ICD-10-CM | POA: Diagnosis not present

## 2017-06-10 ENCOUNTER — Encounter: Payer: Self-pay | Admitting: Family

## 2017-06-10 ENCOUNTER — Ambulatory Visit (INDEPENDENT_AMBULATORY_CARE_PROVIDER_SITE_OTHER): Payer: 59 | Admitting: Family

## 2017-06-10 VITALS — BP 114/76 | HR 75 | Temp 98.0°F | Resp 18 | Ht 64.5 in | Wt 139.4 lb

## 2017-06-10 DIAGNOSIS — N6320 Unspecified lump in the left breast, unspecified quadrant: Secondary | ICD-10-CM

## 2017-06-10 DIAGNOSIS — Z0000 Encounter for general adult medical examination without abnormal findings: Secondary | ICD-10-CM

## 2017-06-10 LAB — URINALYSIS, ROUTINE W REFLEX MICROSCOPIC
Bilirubin Urine: NEGATIVE
HGB URINE DIPSTICK: NEGATIVE
KETONES UR: NEGATIVE
LEUKOCYTES UA: NEGATIVE
Nitrite: NEGATIVE
Specific Gravity, Urine: <=1.005 — AB (ref 1.000–1.030)
Total Protein, Urine: NEGATIVE
URINE GLUCOSE: NEGATIVE
Urobilinogen, UA: 0.2 (ref 0.0–1.0)
WBC, UA: NONE SEEN — AB (ref 0–?)
pH: 6 (ref 5.0–8.0)

## 2017-06-10 LAB — CBC WITH DIFFERENTIAL/PLATELET
Basophils Absolute: 0 10*3/uL (ref 0.0–0.1)
Basophils Relative: 0.9 % (ref 0.0–3.0)
EOS ABS: 0.1 10*3/uL (ref 0.0–0.7)
Eosinophils Relative: 2.4 % (ref 0.0–5.0)
HEMATOCRIT: 37.5 % (ref 36.0–46.0)
Hemoglobin: 12.5 g/dL (ref 12.0–15.0)
LYMPHS PCT: 38.8 % (ref 12.0–46.0)
Lymphs Abs: 1.8 10*3/uL (ref 0.7–4.0)
MCHC: 33.3 g/dL (ref 30.0–36.0)
MCV: 88 fl (ref 78.0–100.0)
MONO ABS: 0.3 10*3/uL (ref 0.1–1.0)
Monocytes Relative: 6.2 % (ref 3.0–12.0)
NEUTROS ABS: 2.4 10*3/uL (ref 1.4–7.7)
Neutrophils Relative %: 51.7 % (ref 43.0–77.0)
PLATELETS: 235 10*3/uL (ref 150.0–400.0)
RBC: 4.26 Mil/uL (ref 3.87–5.11)
RDW: 13.8 % (ref 11.5–15.5)
WBC: 4.6 10*3/uL (ref 4.0–10.5)

## 2017-06-10 LAB — HEPATIC FUNCTION PANEL
ALK PHOS: 50 U/L (ref 39–117)
ALT: 13 U/L (ref 0–35)
AST: 21 U/L (ref 0–37)
Albumin: 4.3 g/dL (ref 3.5–5.2)
BILIRUBIN DIRECT: 0.1 mg/dL (ref 0.0–0.3)
BILIRUBIN TOTAL: 0.6 mg/dL (ref 0.2–1.2)
Total Protein: 6.7 g/dL (ref 6.0–8.3)

## 2017-06-10 LAB — BASIC METABOLIC PANEL
BUN: 17 mg/dL (ref 6–23)
CO2: 29 meq/L (ref 19–32)
CREATININE: 0.9 mg/dL (ref 0.40–1.20)
Calcium: 9.4 mg/dL (ref 8.4–10.5)
Chloride: 105 mEq/L (ref 96–112)
GFR: 68.51 mL/min (ref >60.00)
Glucose, Bld: 92 mg/dL (ref 70–99)
Potassium: 4.5 mEq/L (ref 3.5–5.1)
Sodium: 140 mEq/L (ref 135–145)

## 2017-06-10 LAB — LIPID PANEL
Cholesterol: 197 mg/dL (ref 0–200)
HDL: 60.1 mg/dL (ref >39.00)
LDL Cholesterol: 128 mg/dL — ABNORMAL HIGH (ref 0–99)
NONHDL: 137.07
Total CHOL/HDL Ratio: 3
Triglycerides: 45 mg/dL (ref 0.0–149.0)
VLDL: 9 mg/dL (ref 0.0–40.0)

## 2017-06-10 LAB — TSH: TSH: 2.86 u[IU]/mL (ref 0.35–4.50)

## 2017-06-10 NOTE — Patient Instructions (Signed)
Please complete lab work prior to leaving. Continue healthy diet and regular exercise. You should be contacted about scheduling your mammogram at the [facility]. Please check date of last tetanus. If it has been >10 yrs and you are unable to get from employee health you may schedule a nurse visit with [place].

## 2017-06-10 NOTE — Progress Notes (Signed)
Subjective:    Patient ID: Joanne Jones, female    DOB: [birth year], 57 y.o.   MRN: [id number]  HPI  Patient presents today for complete physical.  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  HENT: Negative for rhinorrhea.   Eyes: Negative for visual disturbance.  Respiratory: Negative for cough.   Cardiovascular: Negative for leg swelling.  Gastrointestinal: Negative for constipation and diarrhea.  Genitourinary: Negative for dysuria and frequency.  Musculoskeletal: Positive for arthralgias.       Left knee soreness and right hip soreness, attributes to recent skiing  Skin: Negative for rash.  Neurological: Negative for headaches.  Hematological: Negative for adenopathy.  Psychiatric/Behavioral:       Denies depression/anxiety       Past Medical History:  Diagnosis Date  . Abnormal Pap smear and cervical HPV (human papillomavirus)    cervical cyst  . Astigmatism    S/P bialteral corneal transplants     Social History   Socioeconomic History  . Marital status: Married    Spouse name: Not on file  . Number of children: Not on file  . Years of education: Not on file  . Highest education level: Not on file  Social Needs  . Financial resource strain: Not on file  . Food insecurity - worry: Not on file  . Food insecurity - inability: Not on file  . Transportation needs - medical: Not on file  . Transportation needs - non-medical: Not on file  Occupational History  . Not on file  Tobacco Use  . Smoking status: Never Smoker  . Smokeless tobacco: Never Used  Substance and Sexual Activity  . Alcohol use: Yes    Alcohol/week: 8.4 oz    Types: 14 Glasses of wine per week  . Drug use: No  . Sexual  activity: Yes    Partners: Male    Birth control/protection: Implant  Other Topics Concern  . 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     Past Surgical History:  Procedure Laterality Date  . BREAST ENHANCEMENT SURGERY  94  . corneal transp;lant  84   left eye  . CORNEAL TRANSPLANT  88   right eye  . removal of vulvar lipoma  1994    Family History  Problem Relation Age of Onset  . Cancer Maternal Grandmother        uterine  . Supraventricular tachycardia Mother        living  . Heart disease Father 67       hx of MV disease, LAD lesion, died of stroke complication of surgery  . Keratoconus Brother   . Hypertension Brother     No Known Allergies  No current outpatient medications on file prior to visit.   No current facility-administered medications on file prior to visit.     BP 114/76 (BP Location: Right Arm, Cuff Size: Normal)   Pulse 75   Temp 98 F (36.7 C) (Oral)   Resp 18   Ht 5' 4.5" (1.638  m)   Wt 139 lb 6.4 oz (63.2 kg)   SpO2 100%   BMI 23.56 kg/m    Objective:   Physical Exam Physical Exam  Constitutional: She is oriented to person, place, and time. She appears well-developed and well-nourished. No distress.  HENT:  Head: Normocephalic and atraumatic.  Right Ear: Tympanic membrane and ear canal normal.  Left Ear: Tympanic membrane and ear canal normal.  Mouth/Throat: Oropharynx is clear and moist.  Eyes: Pupils are equal, round, and reactive to light. No scleral icterus.  Neck: Normal range of motion. No thyromegaly present.  Cardiovascular: Normal rate and regular rhythm.   No murmur heard. Pulmonary/Chest: Effort normal and breath sounds normal. No respiratory distress. He has no wheezes. She has no rales. She exhibits no tenderness.  Abdominal: Soft. Bowel sounds  are normal. She exhibits no distension and no mass. There is no tenderness. There is no rebound and no guarding.  Musculoskeletal: She exhibits no edema.  Lymphadenopathy:    She has no cervical adenopathy.  Neurological: She is alert and oriented to person, place, and time. She exhibits normal muscle tone. Coordination normal.  Skin: Skin is warm and dry.  Psychiatric: She has a normal mood and affect. Her behavior is normal. Judgment and thought content normal.  Breasts: Examined lying (bilateral calcified breast implants) Right: Without masses, retractions, discharge or axillary adenopathy.  Left: Without masses, retractions, discharge or axillary adenopathy.         Assessment & Plan:    Preventative care- refer for follow up diagnostic mammo. [place], up to date. She will check status of tetanus and update with employer if needed. Obtain routine lab work.   EKG tracing is personally reviewed.  EKG notes NSR.  No acute changes.      Assessment & Plan:

## 2017-07-15 ENCOUNTER — Other Ambulatory Visit: Payer: Self-pay | Admitting: Family

## 2017-07-15 DIAGNOSIS — N6320 Unspecified lump in the left breast, unspecified quadrant: Secondary | ICD-10-CM

## 2017-07-22 DIAGNOSIS — H18611 Keratoconus, stable, right eye: Secondary | ICD-10-CM | POA: Diagnosis not present

## 2017-07-22 DIAGNOSIS — H25043 Posterior subcapsular polar age-related cataract, bilateral: Secondary | ICD-10-CM | POA: Diagnosis not present

## 2017-07-22 DIAGNOSIS — H18612 Keratoconus, stable, left eye: Secondary | ICD-10-CM | POA: Diagnosis not present

## 2017-07-22 DIAGNOSIS — H02883 Meibomian gland dysfunction of right eye, unspecified eyelid: Secondary | ICD-10-CM | POA: Diagnosis not present

## 2017-07-22 DIAGNOSIS — H5213 Myopia, bilateral: Secondary | ICD-10-CM | POA: Diagnosis not present

## 2017-07-22 DIAGNOSIS — H02886 Meibomian gland dysfunction of left eye, unspecified eyelid: Secondary | ICD-10-CM | POA: Diagnosis not present

## 2017-07-22 DIAGNOSIS — H18613 Keratoconus, stable, bilateral: Secondary | ICD-10-CM | POA: Diagnosis not present

## 2017-07-22 DIAGNOSIS — H2513 Age-related nuclear cataract, bilateral: Secondary | ICD-10-CM | POA: Diagnosis not present

## 2017-07-22 DIAGNOSIS — Z947 Corneal transplant status: Secondary | ICD-10-CM | POA: Diagnosis not present

## 2017-07-27 ENCOUNTER — Ambulatory Visit: Payer: 59

## 2017-07-27 ENCOUNTER — Ambulatory Visit
Admission: RE | Admit: 2017-07-27 | Discharge: 2017-07-27 | Disposition: A | Discharge status: Home / Self Care | Payer: 59 | Source: Ambulatory Visit | Attending: Family | Admitting: Family

## 2017-07-27 DIAGNOSIS — N6320 Unspecified lump in the left breast, unspecified quadrant: Secondary | ICD-10-CM

## 2017-07-27 DIAGNOSIS — R928 Other abnormal and inconclusive findings on diagnostic imaging of breast: Secondary | ICD-10-CM | POA: Diagnosis not present

## 2017-09-13 ENCOUNTER — Encounter: Payer: Self-pay | Admitting: Family

## 2017-09-20 ENCOUNTER — Ambulatory Visit: Payer: 59 | Admitting: Family Medicine

## 2017-09-20 ENCOUNTER — Encounter: Payer: Self-pay | Admitting: Family Medicine

## 2017-09-20 VITALS — BP 119/77 | HR 73 | Ht 65.0 in | Wt 138.0 lb

## 2017-09-20 DIAGNOSIS — M25561 Pain in right knee: Secondary | ICD-10-CM

## 2017-09-20 DIAGNOSIS — S76011A Strain of muscle, fascia and tendon of right hip, initial encounter: Secondary | ICD-10-CM

## 2017-09-20 DIAGNOSIS — M25551 Pain in right hip: Secondary | ICD-10-CM

## 2017-09-20 NOTE — Patient Instructions (Signed)
You have a right hip flexor strain, less likely very mild arthritis. Start physical therapy for this and do home exercises on days you don't go to therapy. Icing or heat if needed. Aleve 2 tabs twice a day with food OR ibuprofen 600mg  three times a day with food only as needed for pain and inflammation. Compression shorts are a consideration. Follow up with me in 6 weeks.  Your left knee pain is due to arthritis. These are the different medications you can take for this: Tylenol 500mg  1-2 tabs three times a day for pain. Capsaicin, aspercreme, or biofreeze topically up to four times a day may also help with pain. Some supplements that may help for arthritis: Boswellia extract, curcumin, pycnogenol Aleve 1-2 tabs twice a day with food Cortisone injections are an option. If cortisone injections do not help, there are different types of shots that may help but they take longer to take effect. It's important that you continue to stay active. Straight leg raises, knee extensions 3 sets of 10 once a day (add ankle weight if these become too easy). Consider physical therapy to strengthen muscles around the joint that hurts to take pressure off of the joint itself. Shoe inserts with good arch support may be helpful. Heat or ice 15 minutes at a time 3-4 times a day as needed to help with pain. Water aerobics and cycling with low resistance are the best two types of exercise for arthritis though any exercise is ok as long as it doesn't worsen the pain. Follow up with me in 6 weeks.

## 2017-09-23 ENCOUNTER — Encounter: Payer: Self-pay | Admitting: Family Medicine

## 2017-09-23 NOTE — Progress Notes (Signed)
PCP: Debbrah Alar, NP  Subjective:   HPI: Patient is a 57 y.o. female here for right hip and left knee pain.  Patient reports she's had about 5 months of anterior right hip pain. No acute injury or trauma but started to get pain after skiing. She's tried ibuprofen with some improvement. Pain is 2/10 but can be a burning sensation, more uncomfortable at night. Also had about 2 weeks of anterior left knee pain, slight swelling. Worse with stairs. Pain is currently 0/10 but can get up to 4/10. No skin changes, numbness.  Past Medical History:  Diagnosis Date  . Abnormal Pap smear and cervical HPV (human papillomavirus)    cervical cyst  . Astigmatism    S/P bialteral corneal transplants    No current outpatient medications on file prior to visit.   No current facility-administered medications on file prior to visit.     Past Surgical History:  Procedure Laterality Date  . BREAST ENHANCEMENT SURGERY  94  . corneal transp;lant  84   left eye  . CORNEAL TRANSPLANT  88   right eye  . removal of vulvar lipoma  1994    No Known Allergies  Social History   Socioeconomic History  . Marital status: Married    Spouse name: Not on file  . Number of children: Not on file  . Years of education: Not on file  . Highest education level: Not on file  Occupational History  . Not on file  Social Needs  . Financial resource strain: Not on file  . Food insecurity:    Worry: Not on file    Inability: Not on file  . Transportation needs:    Medical: Not on file    Non-medical: Not on file  Tobacco Use  . Smoking status: Never Smoker  . Smokeless tobacco: Never Used  Substance and Sexual Activity  . Alcohol use: Yes    Alcohol/week: 8.4 oz    Types: 14 Glasses of wine per week  . Drug use: No  . Sexual activity: Yes    Partners: Male    Birth control/protection: Implant  Lifestyle  . Physical activity:    Days per week: Not on file    Minutes per session: Not on  file  . Stress: Not on file  Relationships  . Social connections:    Talks on phone: Not on file    Gets together: Not on file    Attends religious service: Not on file    Active member of club or organization: Not on file    Attends meetings of clubs or organizations: Not on file    Relationship status: Not on file  . Intimate partner violence:    Fear of current or ex partner: Not on file    Emotionally abused: Not on file    Physically abused: Not on file    Forced sexual activity: Not on file  Other Topics Concern  . 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     Family History  Problem Relation Age of Onset  . Cancer Maternal Grandmother        uterine  . Supraventricular tachycardia Mother        living  . Heart disease Father 82  hx of MV disease, LAD lesion, died of stroke complication of surgery  . Keratoconus Brother   . Hypertension Brother     BP 119/77   Pulse 73   Ht 5\' 5"  (1.651 m)   Wt 138 lb (62.6 kg)   BMI 22.96 kg/m   Review of Systems: See HPI above.     Objective:  Physical Exam:  Gen: NAD, comfortable in exam room  Back: No gross deformity, scoliosis. No TTP .  No midline or bony TTP. FROM. Strength LEs 5/5 all muscle groups.   2+ MSRs in patellar and achilles tendons, equal bilaterally. Negative SLRs. Sensation intact to light touch bilaterally.  Right hip: No deformity. FROM with 5/5 strength. No tenderness to palpation. NVI distally. Negative logroll. Negative fabers and piriformis stretches.  Left knee: No gross deformity, ecchymoses.  Minimal effusion. No TTP. FROM with 5/5 strength. Negative ant/post drawers. Negative valgus/varus testing. Negative lachmanns. Negative mcmurrays, apleys, patellar apprehension. NV intact distally.   Assessment  & Plan:  1. Right hip pain - no acute injury but started after skiing.  Pain is anterior which would be consistent with hip flexor strain vs mild arthritis - excellent motion making arthritis less likely.  She will start physical therapy.  Ice or heat if needed.  Aleve or ibuprofen.  Tylenol, topical medications, supplements also reviewed.  F/u in 6 weeks.  2.  Right knee pain - consistent with arthritis.  Exam otherwise reassuring.  Discussed tylenol, topical medications, supplements that may help, aleve or ibuprofen.  Consider cortisone injection.  Heat or ice.  F/u in 6 weeks.

## 2017-09-24 DIAGNOSIS — M25561 Pain in right knee: Secondary | ICD-10-CM | POA: Insufficient documentation

## 2017-09-24 DIAGNOSIS — M25552 Pain in left hip: Secondary | ICD-10-CM | POA: Insufficient documentation

## 2017-09-24 DIAGNOSIS — M25551 Pain in right hip: Secondary | ICD-10-CM | POA: Insufficient documentation

## 2017-09-24 NOTE — Assessment & Plan Note (Signed)
consistent with arthritis.  Exam otherwise reassuring.  Discussed tylenol, topical medications, supplements that may help, aleve or ibuprofen.  Consider cortisone injection.  Heat or ice.  F/u in 6 weeks.

## 2017-09-24 NOTE — Assessment & Plan Note (Signed)
no acute injury but started after skiing.  Pain is anterior which would be consistent with hip flexor strain vs mild arthritis - excellent motion making arthritis less likely.  She will start physical therapy.  Ice or heat if needed.  Aleve or ibuprofen.  Tylenol, topical medications, supplements also reviewed.  F/u in 6 weeks.

## 2017-10-06 ENCOUNTER — Encounter: Payer: Self-pay | Admitting: Physical Therapy

## 2017-10-06 ENCOUNTER — Ambulatory Visit: Payer: 59 | Attending: Family Medicine | Admitting: Physical Therapy

## 2017-10-06 DIAGNOSIS — M25551 Pain in right hip: Secondary | ICD-10-CM | POA: Diagnosis not present

## 2017-10-06 DIAGNOSIS — M25651 Stiffness of right hip, not elsewhere classified: Secondary | ICD-10-CM | POA: Insufficient documentation

## 2017-10-06 DIAGNOSIS — R262 Difficulty in walking, not elsewhere classified: Secondary | ICD-10-CM | POA: Diagnosis not present

## 2017-10-06 DIAGNOSIS — M25562 Pain in left knee: Secondary | ICD-10-CM | POA: Diagnosis not present

## 2017-10-06 NOTE — Therapy (Signed)
[facility] [address] [place], [place], [address] Phone: [phone]   Fax:  [phone]  Physical Therapy Evaluation  Patient Details  Name: Joanne Jones MRN: [id number] Date of Birth: 1960/12/31 Referring Provider: Barbaraann Barthel   Encounter Date: 10/06/2017  PT End of Session - 10/06/17 1749    Visit Number  1    Date for PT Re-Evaluation  12/06/17    PT Start Time  1941    PT Stop Time  1750    PT Time Calculation (min)  60 min    Activity Tolerance  Patient tolerated treatment well    Behavior During Therapy  [facility] for tasks assessed/performed       Past Medical History:  Diagnosis Date  . Abnormal Pap smear and cervical HPV (human papillomavirus)    cervical cyst  . Astigmatism    S/P bialteral corneal transplants    Past Surgical History:  Procedure Laterality Date  . BREAST ENHANCEMENT SURGERY  94  . corneal transp;lant  84   left eye  . CORNEAL TRANSPLANT  88   right eye  . removal of vulvar lipoma  1994    There were no vitals filed for this visit.   Subjective Assessment - 10/06/17 1701    Subjective  Patient reports that she went skiing in the winter, did not have any pain with the hip or the knee, she reports that she did not start having the right hip pain until doing the elliptical.  She also has left knee pain.  She reports that the left knee started bothering her after walking in sand for a week.    Patient Stated Goals  have less pain    Currently in Pain?  Yes    Pain Location  Hip left knee    Pain Orientation  Right    Pain Descriptors / Indicators  Aching    Pain Type  Acute pain    Pain Radiating Towards  denies    Pain Onset  More than a month ago    Pain Frequency  Constant    Aggravating Factors   being on back, straightening the knee , walking fast, pain at worst about 5/10    Pain Relieving Factors  rest, at best 2/10    Effect of Pain on Daily Activities  difficulty with  stairs, getting up from the floor and walking fast         [facility] PT Assessment - 10/06/17 0001      Assessment   Medical Diagnosis  right hip and left knee pain    Referring Provider  [place]    Onset Date/Surgical Date  07/06/17    Prior Therapy  no      Precautions   Precautions  None      Balance Screen   Has the patient fallen in the past 6 months  No    Has the patient had a decrease in activity level because of a fear of falling?   No    Is the patient reluctant to leave their home because of a fear of falling?   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, walking, stairs    Leisure  exercises 4 days a week elliptical 40  minutes, weights 1x/week, yoga 1x/week      ROM / Strength   AROM / PROM / Strength  AROM;PROM;Strength      AROM   Overall AROM Comments  Lumbar ROM WNL's with tight HS, Hip ROM WFL, some tightness with ER on the right       Strength   Overall Strength Comments  hip flexor and abduction 4-/5 on the right with pain      Flexibility   Soft Tissue Assessment /Muscle Length  yes    Hamstrings  tight    Quadriceps  tight    ITB  very tight, with some pain    Piriformis  very tight,       Palpation   Palpation comment  lateral tracking patella, she is tender b/n the right GT area and the ASIS.  The right hip flexor feels denser and more prominent than the left      Ambulation/Gait   Gait Comments  pain with right hip flexor at the top of the step, possibly during more of an eccentric phase, right hip has a little less rotation, she is a fast walker                Objective measurements completed on examination: See above findings.                PT Short Term Goals - 10/06/17 1754      PT SHORT TERM GOAL #1   Title  independent with initial HEP    Time  2    Period  Weeks     Status  New        PT Long Term Goals - 10/06/17 1754      PT LONG TERM GOAL #1   Title  stairs without pain    Time  8    Period  Weeks    Status  New      PT LONG TERM GOAL #2   Title  decrease pain overall 50%    Time  8    Period  Weeks    Status  New      PT LONG TERM GOAL #3   Title  increase right hip strength to 4+/5 without pain    Time  8    Period  Weeks    Status  New      PT LONG TERM GOAL #4   Title  return to gym without difficulty    Time  8    Period  Weeks    Status  New             Plan - 10/06/17 1749    Clinical Impression Statement  Patient with 4 months of right hip flexor pain and two months of left patellar pain.  She felt the hip pain start with her normal routine of 40 minutes on the elliptical, the knee started with walking in sand for a week.  She is very tight in the HS, ITB, piriformis and hip flexor.  She has lateral tracking patella.  Has some decreased right hip rotation with walking, the pain in the hip is anterior and lateral, has it with eccentric motions of the hip.    Clinical Presentation  Stable    Clinical Decision Making  Low    Rehab Potential  Good    PT Frequency  1x / week    PT Duration  8 weeks    PT Treatment/Interventions  ADLs/Self Care Home  Management;Electrical Stimulation;Gait training;Ultrasound;Iontophoresis 4mg /ml Dexamethasone;Stair training;Functional mobility training;Therapeutic activities;Therapeutic exercise;Manual techniques;Patient/family education;Dry needling    PT Next Visit Plan  assure HEP, could try ionto, dry needling, needs VMO exercises    Consulted and Agree with Plan of Care  Patient       Patient will benefit from skilled therapeutic intervention in order to improve the following deficits and impairments:  Abnormal gait, Decreased range of motion, Difficulty walking, Increased muscle spasms, Pain, Impaired flexibility, Decreased strength  Visit Diagnosis: Pain in right hip - Plan:  PT plan of care cert/re-cert  Stiffness of right hip, not elsewhere classified - Plan: PT plan of care cert/re-cert  Acute pain of left knee - Plan: PT plan of care cert/re-cert  Difficulty in walking, not elsewhere classified - Plan: PT plan of care cert/re-cert     Problem List Patient Active Problem List   Diagnosis Date Noted  . Right hip pain 09/24/2017  . Right knee pain 09/24/2017  . Encounter for routine gynecological examination 03/22/2016  . Preventative health care 05/23/2015  . Blepharitis 12/24/2014  . 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 Colon polyp 07/07/2013  . Diverticulosis 07/07/2013  . ASCUS favor benign 03/22/2013  . Post corneal transplant 03/21/2013  . Glaucoma suspect of both eyes 02/28/2013  . 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 Endometrial polyp 03/03/2011  . Astigmatism     Sumner Boast., PT 10/06/2017, 5:56 PM  [facility] [place] [address] [place], [place], [address] Phone: [phone]   Fax:  [phone]  Name: Joanne Jones MRN: [id number] Date of Birth: 1961-01-18

## 2017-10-06 NOTE — Patient Instructions (Signed)
<HTML><META HTTP-EQUIV="content-type" CONTENT="text/html;charset=utf-8"><STRONG>             Access Code: [email or web address]       <BR>URL: [email or web address]       <BR><SPAN id="[email or web address]">Date: 10/06/2017</SPAN>                                                     <BR>Prepared by: Lum Babe                             </STRONG>      <BR><BR><SPAN>Exercises</SPAN>      <UL class="program-note-list"><LI>Supine Figure 4 Piriformis Stretch                 - 5 reps                 - 1 sets                 - 20 hold                         - 2x daily                         - 7x weekly                </LI><LI>Supine Figure 4 Piriformis Stretch                 - 5 reps                 - 1 sets                 - 20 hold                         - 2x daily                         - 7x weekly                </LI><LI>Supine ITB Stretch with Strap                 - 5 reps                 - 1 sets                 - 30 hold                         - 2x daily                         - 7x weekly                </LI><LI>Seated Table Hamstring Stretch                 - 5 reps                 - 1 sets                 - 20 hold                         -  2x daily                         - 7x weekly                </LI><LI>Kneeling Hip Flexor Stretch                 - 5 reps                 - 1 sets                 - 20 hold                         - 2x daily                         - 7x weekly                </LI></UL>

## 2017-10-13 ENCOUNTER — Ambulatory Visit: Payer: 59 | Admitting: Physical Therapy

## 2017-10-13 ENCOUNTER — Encounter: Payer: Self-pay | Admitting: Physical Therapy

## 2017-10-13 DIAGNOSIS — R262 Difficulty in walking, not elsewhere classified: Secondary | ICD-10-CM | POA: Diagnosis not present

## 2017-10-13 DIAGNOSIS — M25651 Stiffness of right hip, not elsewhere classified: Secondary | ICD-10-CM

## 2017-10-13 DIAGNOSIS — M25562 Pain in left knee: Secondary | ICD-10-CM | POA: Diagnosis not present

## 2017-10-13 DIAGNOSIS — M25551 Pain in right hip: Secondary | ICD-10-CM | POA: Diagnosis not present

## 2017-10-13 NOTE — Therapy (Signed)
[address] [place], [place], [address] Phone: [phone]   Fax:  [phone]  Physical Therapy Treatment  Patient Details  Name: Joanne Jones MRN: [id number] Date of Birth: 07/03/1960 Referring Provider: Barbaraann Barthel   Encounter Date: 10/13/2017  PT End of Session - 10/13/17 1727    Visit Number  2    Date for PT Re-Evaluation  12/06/17    PT Start Time  6389    PT Stop Time  1750    PT Time Calculation (min)  55 min       Past Medical History:  Diagnosis Date  . Abnormal Pap smear and cervical HPV (human papillomavirus)    cervical cyst  . Astigmatism    S/P bialteral corneal transplants    Past Surgical History:  Procedure Laterality Date  . BREAST ENHANCEMENT SURGERY  94  . corneal transp;lant  84   left eye  . CORNEAL TRANSPLANT  88   right eye  . removal of vulvar lipoma  1994    There were no vitals filed for this visit.  Subjective Assessment - 10/13/17 1723    Subjective  okay-stretches help. knee is almost no issue    Currently in Pain?  Yes    Pain Location  Hip    Pain Orientation  Right                       [facility] Adult PT Treatment/Exercise - 10/13/17 0001      Modalities   Modalities  Electrical Stimulation;Moist Heat;Iontophoresis      Moist Heat Therapy   Number Minutes Moist Heat  15 Minutes    Moist Heat Location  Hip      Electrical Stimulation   Electrical Stimulation Location  hip GT and hip flexor    Electrical Stimulation Action  IFC    Electrical Stimulation Parameters  supine    Electrical Stimulation Goals  Pain;Edema      Iontophoresis   Type of Iontophoresis  Dexamethasone    Location  RT hip    Dose  1.2cc dex    Time  4 hour leave on patch      Manual Therapy   Manual Therapy  Soft tissue mobilization;Myofascial release;Muscle Energy Technique;Passive ROM    Soft tissue mobilization  RT hip flexor and ITB stripping    Myofascial Release  RT hip flexor    Passive ROM  RT hip    Muscle Energy Technique  Rt hip             PT Education - 10/13/17 1726    Education Details  hip flexor hang,ext prone stretch , ITB rolling at home    Person(s) Educated  Patient    Methods  Explanation;Demonstration    Comprehension  Verbalized understanding;Returned demonstration       PT Short Term Goals - 10/13/17 1728      PT SHORT TERM GOAL #1   Title  independent with initial HEP    Status  Achieved        PT Long Term Goals - 10/06/17 1754      PT LONG TERM GOAL #1   Title  stairs without pain    Time  8    Period  Weeks    Status  New      PT LONG TERM GOAL #2   Title  decrease pain overall 50%    Time  8    Period  Weeks    Status  New      PT LONG TERM GOAL #3   Title  increase right hip strength to 4+/5 without pain    Time  8    Period  Weeks    Status  New      PT LONG TERM GOAL #4   Title  return to gym without difficulty    Time  8    Period  Weeks    Status  New            Plan - 10/13/17 1727    Clinical Impression Statement  pt very tight and tender in RT hip flexor but responded well to STW and MET. tenderness over GT and tight ITB-educ in rolling at home. tolerated modalities well.    PT Treatment/Interventions  ADLs/Self Care Home Management;Electrical Stimulation;Gait training;Ultrasound;Iontophoresis 29m/ml Dexamethasone;Stair training;Functional mobility training;Therapeutic activities;Therapeutic exercise;Manual techniques;Patient/family education;Dry needling    PT Next Visit Plan  assess and progress       Patient will benefit from skilled therapeutic intervention in order to improve the following deficits and impairments:  Abnormal gait, Decreased range of motion, Difficulty walking, Increased muscle spasms, Pain, Impaired flexibility, Decreased strength  Visit Diagnosis: Pain in right hip  Stiffness of right hip, not elsewhere classified  Difficulty  in walking, not elsewhere classified     Problem List Patient Active Problem List   Diagnosis Date Noted  . Right hip pain 09/24/2017  . Right knee pain 09/24/2017  . Encounter for routine gynecological examination 03/22/2016  . Preventative health care 05/23/2015  . Blepharitis 12/24/2014  . 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 Colon polyp 07/07/2013  . Diverticulosis 07/07/2013  . ASCUS favor benign 03/22/2013  . Post corneal transplant 03/21/2013  . Glaucoma suspect of both eyes 02/28/2013  . 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 Endometrial polyp 03/03/2011  . Astigmatism     Jelisha Weed,ANGIE PTA 10/13/2017, 5:29 PM  [address] [place] [address]Phone: [phone]  Fax:  [phone] Name: Joanne WILLISMRN: [id number] of Birth: 108-22-1962

## 2017-11-01 ENCOUNTER — Encounter: Payer: Self-pay | Admitting: Family Medicine

## 2017-11-01 ENCOUNTER — Ambulatory Visit (INDEPENDENT_AMBULATORY_CARE_PROVIDER_SITE_OTHER): Payer: 59 | Admitting: Family Medicine

## 2017-11-01 DIAGNOSIS — M25551 Pain in right hip: Secondary | ICD-10-CM

## 2017-11-01 NOTE — Patient Instructions (Signed)
You're doing great. Continue with physical therapy, transition to the home exercise program when you feel comfortable. Make sure you're doing hip side raises and standing hip rotations 3 sets of 10 once a day. Start with 5 minutes on the elliptical - if you do well you can increase by 5 minutes but every other day. Icing if needed. Follow up with me in 6 weeks or as needed.

## 2017-11-01 NOTE — Assessment & Plan Note (Signed)
primarily due to hip flexor strain with some secondary IT band, glut medius overuse/strain.  Continue home exercises, physical therapy.  Icing, ibuprofen if needed.  Discussed how to return to using the elliptical.  F/u in 6 weeks or prn.

## 2017-11-01 NOTE — Progress Notes (Signed)
PCP: Debbrah Alar, NP  Subjective:   HPI: Patient is a 57 y.o. female here for right hip and left knee pain.  6/4: Patient reports she's had about 5 months of anterior right hip pain. No acute injury or trauma but started to get pain after skiing. She's tried ibuprofen with some improvement. Pain is 2/10 but can be a burning sensation, more uncomfortable at night. Also had about 2 weeks of anterior left knee pain, slight swelling. Worse with stairs. Pain is currently 0/10 but can get up to 4/10. No skin changes, numbness.  7/16: Patient reports she feels a little better. Knee doesn't bother her at all. Right hip pain is at 2/10 and a soreness. Worse with stairs. No radiation of pain. No numbness. Rarely taking ibuprofen. Feels pain worsened after just getting [place] from [place] the [place] - more walking.  Past Medical History:  Diagnosis Date  . Abnormal Pap smear and cervical HPV (human papillomavirus)    cervical cyst  . Astigmatism    S/P bialteral corneal transplants    No current outpatient medications on file prior to visit.   No current facility-administered medications on file prior to visit.     Past Surgical History:  Procedure Laterality Date  . BREAST ENHANCEMENT SURGERY  94  . corneal transp;lant  84   left eye  . CORNEAL TRANSPLANT  88   right eye  . removal of vulvar lipoma  1994    No Known Allergies  Social History   Socioeconomic History  . Marital status: Married    Spouse name: Not on file  . Number of children: Not on file  . Years of education: Not on file  . Highest education level: Not on file  Occupational History  . Not on file  Social Needs  . Financial resource strain: Not on file  . Food insecurity:    Worry: Not on file    Inability: Not on file  . Transportation needs:    Medical: Not on file    Non-medical: Not on file  Tobacco Use  . Smoking status: Never Smoker  . Smokeless tobacco: Never Used   Substance and Sexual Activity  . Alcohol use: Yes    Alcohol/week: 8.4 oz    Types: 14 Glasses of wine per week  . Drug use: No  . Sexual activity: Yes    Partners: Male    Birth control/protection: Implant  Lifestyle  . Physical activity:    Days per week: Not on file    Minutes per session: Not on file  . Stress: Not on file  Relationships  . Social connections:    Talks on phone: Not on file    Gets together: Not on file    Attends religious service: Not on file    Active member of club or organization: Not on file    Attends meetings of clubs or organizations: Not on file    Relationship status: Not on file  . Intimate partner violence:    Fear of current or ex partner: Not on file    Emotionally abused: Not on file    Physically abused: Not on file    Forced sexual activity: Not on file  Other Topics Concern  . 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     Family History  Problem Relation Age of Onset  . Cancer Maternal Grandmother        uterine  . Supraventricular tachycardia Mother        living  . Heart disease Father 48       hx of MV disease, LAD lesion, died of stroke complication of surgery  . Keratoconus Brother   . Hypertension Brother     BP 119/76   Pulse 66   Ht 5\' 5"  (1.651 m)   Wt 138 lb (62.6 kg)   BMI 22.96 kg/m   Review of Systems: See HPI above.     Objective:  Physical Exam:  Gen: NAD, comfortable in exam room  Right hip: No deformity. FROM with 5/5 strength, mild pain with hip flexion and straight leg raise. Mild pain also with hip abduction. TTP mildly over proximal IT band. NVI distally. Negative logroll. Negative fabers and piriformis stretches.   Assessment & Plan:  1. Right hip pain - primarily due to hip flexor strain with some secondary IT  band, glut medius overuse/strain.  Continue home exercises, physical therapy.  Icing, ibuprofen if needed.  Discussed how to return to using the elliptical.  F/u in 6 weeks or prn.

## 2017-11-02 ENCOUNTER — Ambulatory Visit: Payer: 59 | Attending: Family Medicine | Admitting: Physical Therapy

## 2017-11-02 ENCOUNTER — Encounter: Payer: Self-pay | Admitting: Physical Therapy

## 2017-11-02 DIAGNOSIS — M25562 Pain in left knee: Secondary | ICD-10-CM | POA: Diagnosis not present

## 2017-11-02 DIAGNOSIS — M25651 Stiffness of right hip, not elsewhere classified: Secondary | ICD-10-CM | POA: Insufficient documentation

## 2017-11-02 DIAGNOSIS — M25551 Pain in right hip: Secondary | ICD-10-CM | POA: Insufficient documentation

## 2017-11-02 DIAGNOSIS — R262 Difficulty in walking, not elsewhere classified: Secondary | ICD-10-CM | POA: Insufficient documentation

## 2017-11-02 NOTE — Therapy (Signed)
[place], [place], [address] Phone: [phone]   Fax:  [phone]  Physical Therapy Treatment  Patient Details  Name: Joanne Jones MRN: [id number] Date of Birth: 1960/09/20 Referring Provider: Barbaraann Barthel   Encounter Date: 11/02/2017  PT End of Session - 11/02/17 1743    Visit Number  3    Date for PT Re-Evaluation  12/06/17    PT Start Time  1655    PT Stop Time  1740    PT Time Calculation (min)  45 min    Activity Tolerance  Patient tolerated treatment well    Behavior During Therapy  [facility] for tasks assessed/performed       Past Medical History:  Diagnosis Date  . Abnormal Pap smear and cervical HPV (human papillomavirus)    cervical cyst  . Astigmatism    S/P bialteral corneal transplants    Past Surgical History:  Procedure Laterality Date  . BREAST ENHANCEMENT SURGERY  94  . corneal transp;lant  84   left eye  . CORNEAL TRANSPLANT  88   right eye  . removal of vulvar lipoma  1994    There were no vitals filed for this visit.  Subjective Assessment - 11/02/17 1738    Subjective  Patient was on vacation the past two weeks, she reports that she was able to hike without much difficulty, she does report seeing MD yesterday and him telling her she needed to continue strength and flexibility and could resume her ellipticla, she reports that she returned to work on Monday and was having pain a 4/10    Currently in Pain?  No/denies c/o some tenderness in the lateral hip area                       [facility] Adult PT Treatment/Exercise - 11/02/17 0001      Iontophoresis   Type of Iontophoresis  Dexamethasone    Location  RT hip    Dose  1.2cc dex    Time  4 hour leave on patch      Manual Therapy   Manual Therapy  Passive ROM    Passive ROM  RT hip, focus on piriformis, ITB and quad/hip flexor stretches             PT Education - 11/02/17 1741    Education  Details  reviewed initial HEP, went over the elliptical start 5 minutes and add 5 minutes a week until back to her normal    Person(s) Educated  Patient    Methods  Explanation;Demonstration    Comprehension  Verbalized understanding;Returned demonstration       PT Short Term Goals - 10/13/17 1728      PT SHORT TERM GOAL #1   Title  independent with initial HEP    Status  Achieved        PT Long Term Goals - 11/02/17 1746      PT LONG TERM GOAL #1   Title  stairs without pain    Status  Achieved      PT LONG TERM GOAL #2   Title  decrease pain overall 50%    Status  Achieved      PT LONG TERM GOAL #3   Title  increase right hip strength to 4+/5 without pain    Status  Partially Met      PT LONG TERM GOAL #4   Title  return to gym without difficulty    Status  Partially Met            Plan - 11/02/17 1744    Clinical Impression Statement  Patient had a great trip without much issues, reports some increased hip pain when she returned to work on Monday, she is still tight in the hip ITB and piriformis.  Gave new HEP and reinforced that she needs to continue with stretches.  She is going to return to her elliptical routine    PT Next Visit Plan  Hold and if no issues will D/C    Consulted and Agree with Plan of Care  Patient       Patient will benefit from skilled therapeutic intervention in order to improve the following deficits and impairments:  Abnormal gait, Decreased range of motion, Difficulty walking, Increased muscle spasms, Pain, Impaired flexibility, Decreased strength  Visit Diagnosis: Pain in right hip  Stiffness of right hip, not elsewhere classified  Difficulty in walking, not elsewhere classified  Acute pain of left knee     Problem List Patient Active Problem List   Diagnosis Date Noted  . Right hip pain 09/24/2017  . Right knee pain 09/24/2017  . Encounter for routine gynecological examination 03/22/2016  . Preventative health care  05/23/2015  . Blepharitis 12/24/2014  . 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 Colon polyp 07/07/2013  . Diverticulosis 07/07/2013  . ASCUS favor benign 03/22/2013  . Post corneal transplant 03/21/2013  . Glaucoma suspect of both eyes 02/28/2013  . 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 Endometrial polyp 03/03/2011  . Astigmatism     Sumner Boast., PT 11/02/2017, 5:47 PM  [address] [place], [place], [address] Phone: [phone]   Fax:  [phone]  Name: RAMYA VANBERGEN MRN: [id number] Date of Birth: 1960-07-15

## 2017-11-02 NOTE — Patient Instructions (Signed)
Access Code: [email or web address]  URL: [email or web address]  Date: 11/02/2017  Prepared by: Lum Babe   Exercises  Standing Hip Abduction with Anchored Resistance - 10 reps - 2 sets - 2 hold - 1x daily - 7x weekly  Standing Hip Extension with Anchored Resistance - 10 reps - 2 sets - 2 hold - 1x daily - 7x weekly  Standing Hip Adduction with Anchored Resistance - 10 reps - 2 sets - 2 hold - 1x daily - 7x weekly  Standing Hip Flexion with Anchored Resistance and Chair Support - 10 reps - 2 sets - 2 hold - 1x daily - 7x weekly  Seated Hip Internal Rotation with Ball and Resistance - 10 reps - 2 sets - 2 hold - 1x daily - 3x weekly

## 2018-02-17 DIAGNOSIS — H18623 Keratoconus, unstable, bilateral: Secondary | ICD-10-CM | POA: Diagnosis not present

## 2018-02-17 DIAGNOSIS — H40013 Open angle with borderline findings, low risk, bilateral: Secondary | ICD-10-CM | POA: Diagnosis not present

## 2018-02-28 DIAGNOSIS — H18623 Keratoconus, unstable, bilateral: Secondary | ICD-10-CM | POA: Diagnosis not present

## 2018-06-16 ENCOUNTER — Encounter: Payer: Self-pay | Admitting: Family

## 2018-06-16 ENCOUNTER — Ambulatory Visit (INDEPENDENT_AMBULATORY_CARE_PROVIDER_SITE_OTHER): Payer: 59 | Admitting: Family

## 2018-06-16 VITALS — BP 109/73 | HR 73 | Temp 97.6°F | Resp 16 | Ht 65.0 in | Wt 136.0 lb

## 2018-06-16 DIAGNOSIS — Z0000 Encounter for general adult medical examination without abnormal findings: Secondary | ICD-10-CM

## 2018-06-16 DIAGNOSIS — Z23 Encounter for immunization: Secondary | ICD-10-CM

## 2018-06-16 LAB — CBC WITH DIFFERENTIAL/PLATELET
BASOS PCT: 1.1 % (ref 0.0–3.0)
Basophils Absolute: 0.1 10*3/uL (ref 0.0–0.1)
Eosinophils Absolute: 0.1 10*3/uL (ref 0.0–0.7)
Eosinophils Relative: 1.8 % (ref 0.0–5.0)
HEMATOCRIT: 39.1 % (ref 36.0–46.0)
Hemoglobin: 13.3 g/dL (ref 12.0–15.0)
LYMPHS PCT: 31.6 % (ref 12.0–46.0)
Lymphs Abs: 1.9 10*3/uL (ref 0.7–4.0)
MCHC: 33.9 g/dL (ref 30.0–36.0)
MCV: 87.9 fl (ref 78.0–100.0)
Monocytes Absolute: 0.3 10*3/uL (ref 0.1–1.0)
Monocytes Relative: 5.6 % (ref 3.0–12.0)
Neutro Abs: 3.5 10*3/uL (ref 1.4–7.7)
Neutrophils Relative %: 59.9 % (ref 43.0–77.0)
Platelets: 245 10*3/uL (ref 150.0–400.0)
RBC: 4.45 Mil/uL (ref 3.87–5.11)
RDW: 15 % (ref 11.5–15.5)
WBC: 5.9 10*3/uL (ref 4.0–10.5)

## 2018-06-16 LAB — BASIC METABOLIC PANEL WITH GFR
BUN: 16 mg/dL (ref 6–23)
CO2: 29 meq/L (ref 19–32)
Calcium: 9.9 mg/dL (ref 8.4–10.5)
Chloride: 103 meq/L (ref 96–112)
Creatinine, Ser: 0.87 mg/dL (ref 0.40–1.20)
GFR: 66.8 mL/min
Glucose, Bld: 88 mg/dL (ref 70–99)
Potassium: 5 meq/L (ref 3.5–5.1)
Sodium: 140 meq/L (ref 135–145)

## 2018-06-16 LAB — LIPID PANEL
Cholesterol: 222 mg/dL — ABNORMAL HIGH (ref 0–200)
HDL: 68.4 mg/dL
LDL Cholesterol: 142 mg/dL — ABNORMAL HIGH (ref 0–99)
NonHDL: 153.33
Total CHOL/HDL Ratio: 3
Triglycerides: 55 mg/dL (ref 0.0–149.0)
VLDL: 11 mg/dL (ref 0.0–40.0)

## 2018-06-16 LAB — HEPATIC FUNCTION PANEL
ALT: 14 U/L (ref 0–35)
AST: 21 U/L (ref 0–37)
Albumin: 4.8 g/dL (ref 3.5–5.2)
Alkaline Phosphatase: 51 U/L (ref 39–117)
Bilirubin, Direct: 0.2 mg/dL (ref 0.0–0.3)
Total Bilirubin: 0.8 mg/dL (ref 0.2–1.2)
Total Protein: 6.9 g/dL (ref 6.0–8.3)

## 2018-06-16 LAB — TSH: TSH: 3.1 u[IU]/mL (ref 0.35–4.50)

## 2018-06-16 NOTE — Progress Notes (Signed)
Subjective:    Patient ID: Joanne Jones, female    DOB: Sep 19, 1960, 58 y.o.   MRN: [id number]  HPI  Patient presents today for complete physical.  Immunizations: tetanus 03/22/13, flu shot up to date, due for shingrix Diet: healthy Exercise:regular Colonoscopy: 1/14, due  Dexa: 06/16/16  Pap Smear:03/22/16- due 12/20 Mammogram: 07/27/17 Vision: up to date Dental: up to date    Review of Systems  Constitutional: Negative for unexpected weight change.  HENT: Negative for rhinorrhea.   Respiratory: Negative for cough.   Cardiovascular: Negative for leg swelling.  Gastrointestinal: Negative for blood in stool, constipation and diarrhea.  Genitourinary: Negative for dysuria, frequency and hematuria.  Musculoskeletal: Negative for myalgias.       Occasional knee/back hip pain  Skin: Negative for rash.  Neurological: Negative for headaches.  Hematological: Negative for adenopathy.  Psychiatric/Behavioral:       Denies depression anxiety   Past Medical History:  Diagnosis Date  . Abnormal Pap smear and cervical HPV (human papillomavirus)    cervical cyst  . Astigmatism    S/P bialteral corneal transplants     Social History   Socioeconomic History  . Marital status: Married    Spouse name: Not on file  . Number of children: Not on file  . Years of education: Not on file  . Highest education level: Not on file  Occupational History  . Not on file  Social Needs  . Financial resource strain: Not on file  . Food insecurity:    Worry: Not on file    Inability: Not on file  . Transportation needs:    Medical: Not on file    Non-medical: Not on file  Tobacco Use  . Smoking status: Never Smoker  . Smokeless tobacco: Never Used  Substance and Sexual Activity  . Alcohol use: Yes    Alcohol/week: 14.0 standard drinks    Types: 14 Glasses of wine per week  . Drug use: No  . Sexual activity: Yes    Partners: Male    Birth control/protection: Implant  Lifestyle  .  Physical activity:    Days per week: Not on file    Minutes per session: Not on file  . Stress: Not on file  Relationships  . Social connections:    Talks on phone: Not on file    Gets together: Not on file    Attends religious service: Not on file    Active member of club or organization: Not on file    Attends meetings of clubs or organizations: Not on file    Relationship status: Not on file  . Intimate partner violence:    Fear of current or ex partner: Not on file    Emotionally abused: Not on file    Physically abused: Not on file    Forced sexual activity: Not on file  Other Topics Concern  . 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     Past Surgical History:  Procedure Laterality Date  . BREAST ENHANCEMENT SURGERY  94  . corneal transp;lant  84   left eye  . CORNEAL TRANSPLANT  88   right eye  . removal of vulvar lipoma  1994    Family History  Problem Relation Age of Onset  .  Cancer Maternal Grandmother        uterine  . Supraventricular tachycardia Mother        living  . Heart disease Father 63       hx of MV disease, LAD lesion, died of stroke complication of surgery  . Keratoconus Brother   . Hypertension Brother     No Known Allergies  No current outpatient medications on file prior to visit.   No current facility-administered medications on file prior to visit.     BP 109/73 (BP Location: Right Arm, Patient Position: Sitting, Cuff Size: Normal)   Pulse 73   Temp 97.6 F (36.4 C) (Oral)   Resp 16   Ht 5\' 5"  (1.651 m)   Wt 136 lb (61.7 kg)   SpO2 100%   BMI 22.63 kg/m       Objective:   Physical Exam Physical Exam  Constitutional: She is oriented to person, place, and time. She appears well-developed and well-nourished. No distress.  HENT:  Head: Normocephalic  and atraumatic.  Right Ear: Tympanic membrane and ear canal normal.  Left Ear: Tympanic membrane and ear canal normal.  Mouth/Throat: Oropharynx is clear and moist.  Eyes: Pupils are equal, round, and reactive to light. No scleral icterus.  Neck: Normal range of motion. No thyromegaly present.  Cardiovascular: Normal rate and regular rhythm.   No murmur heard. Pulmonary/Chest: Effort normal and breath sounds normal. No respiratory distress. He has no wheezes. She has no rales. She exhibits no tenderness.  Abdominal: Soft. Bowel sounds are normal. She exhibits no distension and no mass. There is no tenderness. There is no rebound and no guarding.  Musculoskeletal: She exhibits no edema.  Lymphadenopathy:    She has no cervical adenopathy.  Neurological: She is alert and oriented to person, place, and time. She has normal patellar reflexes. She exhibits normal muscle tone. Coordination normal.  Skin: Skin is warm and dry.  Psychiatric: She has a normal mood and affect. Her behavior is normal. Judgment and thought content normal.  Breasts: Examined lying Right: Without masses, retractions, discharge or axillary adenopathy.  Left: Without masses, retractions, discharge or axillary adenopathy. (exam limited bilaterally by calcified implants           Assessment & Plan:  Preventative care- continue healthy diet and and exercise.  Obtain routine lab work. She will contact GI to arrange follow up colonoscopy. She received a recall from GI. Pap up todate. Will repeat next year..  Obtain routine lab work. Shingrix #1 today. Tetanus and flu up to date.         Assessment & Plan:

## 2018-06-16 NOTE — Patient Instructions (Signed)
Please complete lab work prior to leaving. Schedule follow up colonoscopy with Dr. Collene Mares. Continue healthy diet and regular exercise.

## 2018-06-16 NOTE — Addendum Note (Signed)
Addended by: Jiles Prows on: 06/16/2018 09:33 AM   Modules accepted: Orders

## 2018-07-03 NOTE — Telephone Encounter (Signed)
Received message that pt has not read above mychart message. Mailed letter.

## 2018-08-15 ENCOUNTER — Ambulatory Visit (INDEPENDENT_AMBULATORY_CARE_PROVIDER_SITE_OTHER): Payer: 59 | Admitting: *Deleted

## 2018-08-15 ENCOUNTER — Other Ambulatory Visit: Payer: Self-pay

## 2018-08-15 ENCOUNTER — Encounter: Payer: Self-pay | Admitting: *Deleted

## 2018-08-15 DIAGNOSIS — Z23 Encounter for immunization: Secondary | ICD-10-CM

## 2018-08-15 NOTE — Patient Instructions (Signed)
After obtaining consent, and per orders of Debbrah Alar, FNP, 0.60mL injection of Shingrix given by [facility]. Patient tolerated injection well and was told to report any adverse reaction to [place] immediately/SLS 04/28

## 2018-10-30 DIAGNOSIS — H18623 Keratoconus, unstable, bilateral: Secondary | ICD-10-CM | POA: Diagnosis not present

## 2019-03-28 DIAGNOSIS — H18623 Keratoconus, unstable, bilateral: Secondary | ICD-10-CM | POA: Diagnosis not present

## 2019-03-28 DIAGNOSIS — H40013 Open angle with borderline findings, low risk, bilateral: Secondary | ICD-10-CM | POA: Diagnosis not present

## 2019-05-02 DIAGNOSIS — H02883 Meibomian gland dysfunction of right eye, unspecified eyelid: Secondary | ICD-10-CM | POA: Diagnosis not present

## 2019-05-02 DIAGNOSIS — H02886 Meibomian gland dysfunction of left eye, unspecified eyelid: Secondary | ICD-10-CM | POA: Diagnosis not present

## 2019-05-02 DIAGNOSIS — H25043 Posterior subcapsular polar age-related cataract, bilateral: Secondary | ICD-10-CM | POA: Diagnosis not present

## 2019-05-02 DIAGNOSIS — H18623 Keratoconus, unstable, bilateral: Secondary | ICD-10-CM | POA: Diagnosis not present

## 2019-05-02 DIAGNOSIS — H524 Presbyopia: Secondary | ICD-10-CM | POA: Diagnosis not present

## 2019-05-02 DIAGNOSIS — H5213 Myopia, bilateral: Secondary | ICD-10-CM | POA: Diagnosis not present

## 2019-05-02 DIAGNOSIS — H25031 Anterior subcapsular polar age-related cataract, right eye: Secondary | ICD-10-CM | POA: Diagnosis not present

## 2019-05-02 DIAGNOSIS — H2513 Age-related nuclear cataract, bilateral: Secondary | ICD-10-CM | POA: Diagnosis not present

## 2019-05-02 DIAGNOSIS — Z947 Corneal transplant status: Secondary | ICD-10-CM | POA: Diagnosis not present

## 2019-06-18 ENCOUNTER — Encounter: Payer: 59 | Admitting: Family

## 2019-07-03 ENCOUNTER — Other Ambulatory Visit: Payer: Self-pay

## 2019-07-04 ENCOUNTER — Ambulatory Visit (INDEPENDENT_AMBULATORY_CARE_PROVIDER_SITE_OTHER): Payer: 59 | Admitting: Family

## 2019-07-04 ENCOUNTER — Other Ambulatory Visit (HOSPITAL_COMMUNITY)
Admission: RE | Admit: 2019-07-04 | Discharge: 2019-07-04 | Disposition: A | Discharge status: Home / Self Care | Payer: 59 | Source: Ambulatory Visit | Attending: Family | Admitting: Family

## 2019-07-04 ENCOUNTER — Other Ambulatory Visit: Payer: Self-pay

## 2019-07-04 ENCOUNTER — Encounter: Payer: Self-pay | Admitting: Family

## 2019-07-04 VITALS — BP 114/66 | HR 83 | Temp 97.1°F | Resp 16 | Ht 64.3 in | Wt 136.0 lb

## 2019-07-04 DIAGNOSIS — Z0000 Encounter for general adult medical examination without abnormal findings: Secondary | ICD-10-CM

## 2019-07-04 DIAGNOSIS — Z01419 Encounter for gynecological examination (general) (routine) without abnormal findings: Secondary | ICD-10-CM | POA: Diagnosis not present

## 2019-07-04 DIAGNOSIS — M25551 Pain in right hip: Secondary | ICD-10-CM | POA: Diagnosis not present

## 2019-07-04 DIAGNOSIS — Z23 Encounter for immunization: Secondary | ICD-10-CM

## 2019-07-04 NOTE — Progress Notes (Signed)
Subjective:    Patient ID: Joanne Jones, female    DOB: 1960/05/17, 59 y.o.   MRN: [id number]  HPI  Patient is a 59 yr old female who presents today for cpx.  Patient presents today for complete physical.  Immunizations: up to date Diet: healthy Wt Readings from Last 3 Encounters:  07/04/19 136 lb (61.7 kg)  06/16/18 136 lb (61.7 kg)  11/01/17 138 lb (62.6 kg)  Exercise: enjoys walking Colonoscopy:07/04/13 Dexa: 2018 Pap Smear: 03/22/16 Mammogram: due  She reports that she has a 2 year hx of right sided hip pain. Saw Sports medicine and they recommended PT.  PT did not help her pain. Sometimes she reports that the hip pain causes her to limp. Wonders what else she can do for the hip pain.   Review of Systems  Constitutional: Negative for unexpected weight change.  HENT: Negative for hearing loss and rhinorrhea.   Eyes: Negative for visual disturbance.  Respiratory: Negative for cough and shortness of breath.   Cardiovascular: Negative for chest pain.  Gastrointestinal: Negative for blood in stool, constipation, diarrhea, nausea and vomiting.  Genitourinary: Negative for dysuria, frequency and hematuria.  Musculoskeletal: Positive for arthralgias (just right hip). Negative for myalgias.  Skin: Negative for rash.  Neurological: Negative for headaches.  Hematological: Negative for adenopathy.  Psychiatric/Behavioral:       Denies depression/anxiety       Past Medical History:  Diagnosis Date  . Abnormal Pap smear and cervical HPV (human papillomavirus)    cervical cyst  . Astigmatism    S/P bialteral corneal transplants     Social History   Socioeconomic History  . Marital status: Married    Spouse name: Not on file  . Number of children: Not on file  . Years of education: Not on file  . Highest education level: Not on file  Occupational History  . Not on file  Tobacco Use  . Smoking status: Never Smoker  . Smokeless tobacco: Never Used  Substance and  Sexual Activity  . Alcohol use: Yes    Alcohol/week: 14.0 standard drinks    Types: 14 Glasses of wine per week  . Drug use: No  . Sexual activity: Yes    Partners: Male    Birth control/protection: Implant  Other Topics Concern  . 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    Social Determinants of Health   Financial Resource Strain:   . Difficulty of Paying Living Expenses:   Food Insecurity:   . Worried About Charity fundraiser in the Last Year:   . Arboriculturist in the Last Year:   Transportation Needs:   . Film/video editor (Medical):   Marland Kitchen Lack of Transportation (Non-Medical):   Physical Activity:   . Days of Exercise per Week:   . Minutes of Exercise per Session:   Stress:   . Feeling of Stress :   Social Connections:   . Frequency of Communication with Friends and Family:   . Frequency of Social Gatherings with Friends and Family:   . Attends Religious Services:   . Active Member of Clubs or Organizations:   . Attends Archivist Meetings:   Marland Kitchen Marital Status:   Intimate Partner Violence:   .  Fear of Current or Ex-Partner:   . Emotionally Abused:   Marland Kitchen Physically Abused:   . Sexually Abused:     Past Surgical History:  Procedure Laterality Date  . BREAST ENHANCEMENT SURGERY  94  . corneal transp;lant  84   left eye  . CORNEAL TRANSPLANT  88   right eye  . removal of vulvar lipoma  1994    Family History  Problem Relation Age of Onset  . Cancer Maternal Grandmother        uterine  . Supraventricular tachycardia Mother        living  . Heart disease Father 5       hx of MV disease, LAD lesion, died of stroke complication of surgery  . Keratoconus Brother   . Hypertension Brother     No Known Allergies  No current outpatient medications on file prior to visit.    No current facility-administered medications on file prior to visit.    BP 114/66 (BP Location: Right Arm, Patient Position: Sitting, Cuff Size: Small)   Pulse 83   Temp (!) 97.1 F (36.2 C) (Temporal)   Resp 16   Ht 5' 4.3" (1.633 m)   Wt 136 lb (61.7 kg)   SpO2 100%   BMI 23.13 kg/m    Objective:   Physical Exam Physical Exam  Constitutional: She is oriented to person, place, and time. She appears well-developed and well-nourished. No distress.  HENT:  Head: Normocephalic and atraumatic.  Right Ear: Tympanic membrane and ear canal normal.  Left Ear: Tympanic membrane and ear canal normal.  Mouth/Throat:Not examined, pt wearing mask Eyes: Pupils are equal, round, and reactive to light. No scleral icterus.  Neck: Normal range of motion. No thyromegaly present.  Cardiovascular: Normal rate and regular rhythm.   No murmur heard. Pulmonary/Chest: Effort normal and breath sounds normal. No respiratory distress. He has no wheezes. She has no rales. She exhibits no tenderness.  Abdominal: Soft. Bowel sounds are normal. She exhibits no distension and no mass. There is no tenderness. There is no rebound and no guarding.  Musculoskeletal: She exhibits no edema.  Lymphadenopathy:    She has no cervical adenopathy.  Neurological: She is alert and oriented to person, place, and time. She has normal patellar reflexes. She exhibits normal muscle tone. Coordination normal.  Skin: Skin is warm and dry.  Psychiatric: She has a normal mood and affect. Her behavior is normal. Judgment and thought content normal.  Breasts: Examined lying (bilateral breast implants noted) Right: Without masses, retractions, discharge or axillary adenopathy.  Left: Without masses, retractions, discharge or axillary adenopathy.  Inguinal/mons: Normal without inguinal adenopathy  External genitalia: Normal  BUS/Urethra/Skene's glands: Normal  Bladder: Normal  Vagina: Normal  Cervix: Normal  Uterus: normal in  size, shape and contour. Midline and mobile  Adnexa/parametria:  Rt: Without masses or tenderness.  Lt: Without masses or tenderness.  Anus and perineum: Normal            Assessment & Plan:   Preventative care- immunizations reviewed and up to date. Mammo/colo up to date.  Pap performed today.  Continue healthy diet and regular exercise. Obtain routine lab work.   Right hip pain- Not improving. will refer to orthopedics.         Assessment & Plan:

## 2019-07-04 NOTE — Progress Notes (Signed)
cy

## 2019-07-04 NOTE — Patient Instructions (Addendum)
Please complete lab work prior to leaving. Continue healthy diet and regular exercise.  Preventive Care 40-60 Years Old, Female Preventive care refers to visits with your health care provider and lifestyle choices that can promote health and wellness. This includes:  A yearly physical exam. This may also be called an annual well check.  Regular dental visits and eye exams.  Immunizations.  Screening for certain conditions.  Healthy lifestyle choices, such as eating a healthy diet, getting regular exercise, not using drugs or products that contain nicotine and tobacco, and limiting alcohol use. What can I expect for my preventive care visit? Physical exam Your health care provider will check your:  Height and weight. This may be used to calculate body mass index (BMI), which tells if you are at a healthy weight.  Heart rate and blood pressure.  Skin for abnormal spots. Counseling Your health care provider may ask you questions about your:  Alcohol, tobacco, and drug use.  Emotional well-being.  Home and relationship well-being.  Sexual activity.  Eating habits.  Work and work environment.  Method of birth control.  Menstrual cycle.  Pregnancy history. What immunizations do I need?  Influenza (flu) vaccine  This is recommended every year. Tetanus, diphtheria, and pertussis (Tdap) vaccine  You may need a Td booster every 10 years. Varicella (chickenpox) vaccine  You may need this if you have not been vaccinated. Zoster (shingles) vaccine  You may need this after age 60. Measles, mumps, and rubella (MMR) vaccine  You may need at least one dose of MMR if you were born in 1957 or later. You may also need a second dose. Pneumococcal conjugate (PCV13) vaccine  You may need this if you have certain conditions and were not previously vaccinated. Pneumococcal polysaccharide (PPSV23) vaccine  You may need one or two doses if you smoke cigarettes or if you have  certain conditions. Meningococcal conjugate (MenACWY) vaccine  You may need this if you have certain conditions. Hepatitis A vaccine  You may need this if you have certain conditions or if you travel or work in places where you may be exposed to hepatitis A. Hepatitis B vaccine  You may need this if you have certain conditions or if you travel or work in places where you may be exposed to hepatitis B. Haemophilus influenzae type b (Hib) vaccine  You may need this if you have certain conditions. Human papillomavirus (HPV) vaccine  If recommended by your health care provider, you may need three doses over 6 months. You may receive vaccines as individual doses or as more than one vaccine together in one shot (combination vaccines). Talk with your health care provider about the risks and benefits of combination vaccines. What tests do I need? Blood tests  Lipid and cholesterol levels. These may be checked every 5 years, or more frequently if you are over 50 years old.  Hepatitis C test.  Hepatitis B test. Screening  Lung cancer screening. You may have this screening every year starting at age 55 if you have a 30-pack-year history of smoking and currently smoke or have quit within the past 15 years.  Colorectal cancer screening. All adults should have this screening starting at age 50 and continuing until age 75. Your health care provider may recommend screening at age 45 if you are at increased risk. You will have tests every 1-10 years, depending on your results and the type of screening test.  Diabetes screening. This is done by checking your blood sugar (  blood sugar (glucose) after you have not eaten for a while (fasting). You may have this done every 1-3 years.  Mammogram. This may be done every 1-2 years. Talk with your health care provider about when you should start having regular mammograms. This may depend on whether you have a family history of breast cancer.  BRCA-related cancer  screening. This may be done if you have a family history of breast, ovarian, tubal, or peritoneal cancers.  Pelvic exam and Pap test. This may be done every 3 years starting at age 59. Starting at age 65, this may be done every 5 years if you have a Pap test in combination with an HPV test. Other tests  Sexually transmitted disease (STD) testing.  Bone density scan. This is done to screen for osteoporosis. You may have this scan if you are at high risk for osteoporosis. Follow these instructions at home: Eating and drinking  Eat a diet that includes fresh fruits and vegetables, whole grains, lean protein, and low-fat dairy.  Take vitamin and mineral supplements as recommended by your health care provider.  Do not drink alcohol if: ? Your health care provider tells you not to drink. ? You are pregnant, may be pregnant, or are planning to become pregnant.  If you drink alcohol: ? Limit how much you have to 0-1 drink a day. ? Be aware of how much alcohol is in your drink. In the [place] one drink equals one 12 oz bottle of beer (355 mL), one 5 oz glass of wine (148 mL), or one 1 oz glass of hard liquor (44 mL). Lifestyle  Take daily care of your teeth and gums.  Stay active. Exercise for at least 30 minutes on 5 or more days each week.  Do not use any products that contain nicotine or tobacco, such as cigarettes, e-cigarettes, and chewing tobacco. If you need help quitting, ask your health care provider.  If you are sexually active, practice safe sex. Use a condom or other form of birth control (contraception) in order to prevent pregnancy and STIs (sexually transmitted infections).  If told by your health care provider, take low-dose aspirin daily starting at age 20. What's next?  Visit your health care provider once a year for a well check visit.  Ask your health care provider how often you should have your eyes and teeth checked.  Stay up to date on all vaccines. This  information is not intended to replace advice given to you by your health care provider. Make sure you discuss any questions you have with your health care provider. Document Revised: 12/15/2017 Document Reviewed: 12/15/2017 Elsevier Patient Education  2020 [employer].

## 2019-07-09 ENCOUNTER — Telehealth: Payer: Self-pay | Admitting: Family

## 2019-07-09 LAB — CYTOLOGY - PAP
Comment: NEGATIVE
Diagnosis: NEGATIVE
High risk HPV: NEGATIVE

## 2019-07-09 NOTE — Addendum Note (Signed)
Addended by: Jiles Prows on: 07/09/2019 04:50 PM   Modules accepted: Orders

## 2019-07-09 NOTE — Telephone Encounter (Signed)
Pt want to go to [facility]. Can you have labs moved to [place]? Thanks

## 2019-07-09 NOTE — Telephone Encounter (Signed)
Please contact pt to schedule a lab appointment.

## 2019-07-12 ENCOUNTER — Ambulatory Visit: Payer: 59 | Admitting: Orthopaedic Surgery

## 2019-07-25 ENCOUNTER — Ambulatory Visit (INDEPENDENT_AMBULATORY_CARE_PROVIDER_SITE_OTHER): Payer: 59

## 2019-07-25 ENCOUNTER — Encounter: Payer: Self-pay | Admitting: Orthopaedic Surgery

## 2019-07-25 ENCOUNTER — Ambulatory Visit (INDEPENDENT_AMBULATORY_CARE_PROVIDER_SITE_OTHER): Payer: 59 | Admitting: Orthopaedic Surgery

## 2019-07-25 ENCOUNTER — Other Ambulatory Visit: Payer: Self-pay

## 2019-07-25 VITALS — Ht 64.0 in | Wt 136.0 lb

## 2019-07-25 DIAGNOSIS — M7061 Trochanteric bursitis, right hip: Secondary | ICD-10-CM

## 2019-07-25 DIAGNOSIS — M25559 Pain in unspecified hip: Secondary | ICD-10-CM | POA: Diagnosis not present

## 2019-07-25 DIAGNOSIS — M25551 Pain in right hip: Secondary | ICD-10-CM

## 2019-07-25 MED ORDER — LIDOCAINE HCL 1 % IJ SOLN
3.0000 mL | INTRAMUSCULAR | Status: AC | PRN
  Administered 2019-07-25: 3 mL

## 2019-07-25 MED ORDER — METHYLPREDNISOLONE ACETATE 40 MG/ML IJ SUSP
40.0000 mg | INTRAMUSCULAR | Status: AC | PRN
  Administered 2019-07-25: 40 mg via INTRA_ARTICULAR

## 2019-07-25 NOTE — Progress Notes (Signed)
Office Visit Note   Patient: Joanne Jones           Date of Birth: 07-Jul-1960           MRN: [id number] Visit Date: 07/25/2019              Requested by: Joanne Alar, NP [address] [place],  [place] [address] PCP: Joanne Alar, NP   Assessment & Plan: Visit Diagnoses:  1. Hip pain   2. Trochanteric bursitis, right hip     Plan: We did talk in detail about her right hip.  I think her most likely issue is trochanteric bursitis.  There is some arthritic findings in the hip joint itself.  I did show her stretching exercises that she demonstrated back to me.  I recommended Voltaren gel over the trochanteric area 2-3 times a day as well as trying stretching exercises at least twice daily.  Also recommend a steroid injection of the trochanteric area which she agreed to and tolerated well.  I did explain the risk benefits of steroid injections.  All questions and concerns were answered and addressed.  Follow-up will be as needed.  If things worsen anyway or are not getting better she will let me know.  She would be then a candidate for an MRI of her right hip.  Follow-Up Instructions: Return if symptoms worsen or fail to improve.   Orders:  Orders Placed This Encounter  Procedures  . Large Joint Inj  . XR HIP UNILAT W OR W/O PELVIS 1V RIGHT   No orders of the defined types were placed in this encounter.     Procedures: Large Joint Inj: R greater trochanter on 07/25/2019 9:08 AM Indications: pain and diagnostic evaluation Details: 22 G 1.5 in needle, lateral approach  Arthrogram: No  Medications: 3 mL lidocaine 1 %; 40 mg methylPREDNISolone acetate 40 MG/ML Outcome: tolerated well, no immediate complications Procedure, treatment alternatives, risks and benefits explained, specific risks discussed. Consent was given by the patient. Immediately prior to procedure a time out was called to verify the correct patient, procedure, equipment, support staff and  site/side marked as required. Patient was prepped and draped in the usual sterile fashion.       Clinical Data: No additional findings.   Subjective: Chief Complaint  Patient presents with  . Right Hip - Pain  Joanne Jones is a very pleasant 59 year old active female who comes in for evaluation treatment of a 2-year history of right hip pain.  She has done some physical therapy she states without any help.  She is very active and has been uncomfortable laying on that side of her hip.  She denies any groin pain.  She reports 1 injury to her hip or her knee skiing over the last year or so.  She never had surgery on her hip.  She works at [employer].  She does use the stairs regularly there and does not use elevator.  She also works out using elliptical and does walks with her husband.  HPI  Review of Systems Currently denies any headache, chest pain, shortness of breath, fever, chills, nausea, vomiting  Objective: Vital Signs: Ht 5\' 4"  (1.626 m)   Wt 136 lb (61.7 kg)   BMI 23.34 kg/m   Physical Exam She is alert and orient x3 and in no acute distress Ortho Exam Examination of her left hip is normal examination of her right hip shows pain to palpation of the trochanteric area  and the proximal IT band.  There is really no pain in the groin but there is some slight stiffness and decreased motion when I put the right hip through rotation compared to the left hip. Specialty Comments:  No specialty comments available.  Imaging: XR HIP UNILAT W OR W/O PELVIS 1V RIGHT  Result Date: 07/25/2019 An AP pelvis and lateral of the right hip shows no acute findings.  There is arthritic findings of the right hip joint when comparing the right and left hip joints with slight superior lateral narrowing.  There is evidence of slight cystic changes and a small osteophyte off of the superior lateral femoral head.  There is also slightly more uncovering of that femoral head compared to the left  side.    PMFS History: Patient Active Problem List   Diagnosis Date Noted  . Right hip pain 09/24/2017  . Right knee pain 09/24/2017  . Encounter for routine gynecological examination 03/22/2016  . Preventative health care 05/23/2015  . Blepharitis 12/24/2014  . 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 Colon polyp 07/07/2013  . Diverticulosis 07/07/2013  . ASCUS favor benign 03/22/2013  . Post corneal transplant 03/21/2013  . Glaucoma suspect of both eyes 02/28/2013  . 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 Endometrial polyp 03/03/2011  . Astigmatism    Past Medical History:  Diagnosis Date  . Abnormal Pap smear and cervical HPV (human papillomavirus)    cervical cyst  . Astigmatism    S/P bialteral corneal transplants    Family History  Problem Relation Age of Onset  . Cancer Maternal Grandmother        uterine  . Supraventricular tachycardia Mother        living  . Heart disease Father 35       hx of MV disease, LAD lesion, died of stroke complication of surgery  . Keratoconus Brother   . Hypertension Brother   . Anxiety disorder Brother     Past Surgical History:  Procedure Laterality Date  . BREAST ENHANCEMENT SURGERY  94  . corneal transp;lant  84   left eye  . CORNEAL TRANSPLANT  88   right eye  . removal of vulvar lipoma  1994   Social History   Occupational History  . Not on file  Tobacco Use  . Smoking status: Never Smoker  . Smokeless tobacco: Never Used  Substance and Sexual Activity  . Alcohol use: Yes    Alcohol/week: 14.0 standard drinks    Types: 14 Glasses of wine per week  . Drug use: No  . Sexual activity: Yes    Partners: Male    Birth control/protection: Implant

## 2019-08-09 ENCOUNTER — Telehealth: Payer: Self-pay | Admitting: Family

## 2019-08-09 NOTE — Telephone Encounter (Signed)
See mychart.  

## 2019-09-07 ENCOUNTER — Other Ambulatory Visit: Payer: Self-pay

## 2019-09-07 ENCOUNTER — Encounter: Payer: Self-pay | Admitting: Orthopaedic Surgery

## 2019-09-07 DIAGNOSIS — M25559 Pain in unspecified hip: Secondary | ICD-10-CM

## 2019-10-06 ENCOUNTER — Ambulatory Visit
Admission: RE | Admit: 2019-10-06 | Discharge: 2019-10-06 | Disposition: A | Discharge status: Home / Self Care | Payer: 59 | Source: Ambulatory Visit | Attending: Orthopaedic Surgery | Admitting: Orthopaedic Surgery

## 2019-10-06 ENCOUNTER — Other Ambulatory Visit: Payer: Self-pay

## 2019-10-06 DIAGNOSIS — M24851 Other specific joint derangements of right hip, not elsewhere classified: Secondary | ICD-10-CM | POA: Diagnosis not present

## 2019-10-06 DIAGNOSIS — M25559 Pain in unspecified hip: Secondary | ICD-10-CM

## 2019-10-15 ENCOUNTER — Encounter: Payer: Self-pay | Admitting: Orthopaedic Surgery

## 2019-10-30 ENCOUNTER — Ambulatory Visit (INDEPENDENT_AMBULATORY_CARE_PROVIDER_SITE_OTHER): Payer: 59 | Admitting: Orthopaedic Surgery

## 2019-10-30 ENCOUNTER — Other Ambulatory Visit: Payer: Self-pay

## 2019-10-30 ENCOUNTER — Encounter: Payer: Self-pay | Admitting: Orthopaedic Surgery

## 2019-10-30 DIAGNOSIS — M1611 Unilateral primary osteoarthritis, right hip: Secondary | ICD-10-CM | POA: Diagnosis not present

## 2019-10-30 NOTE — Progress Notes (Signed)
Office Visit Note   Patient: Joanne Jones           Date of Birth: Apr 24, 1960           MRN: [id number] Visit Date: 10/30/2019              Requested by: Joanne Jones PCP: Joanne Jones   Assessment & Plan: Visit Diagnoses:  1. Primary osteoarthritis of right hip     Plan: Given patient's arthritic findings of the right hip with significant bone marrow edema, thinning throughout the articular cartilage and subchondral cyst formation.  Also given the fact that he is affecting her activities of daily living she is unable to exercise due to the right hip pain would recommend right total hip arthroplasty.  Only able conservative treatment to be tried to be an intra-articular injection however felt that this would only be postponing a hip replacement.  Patient states she would like to have a total hip replacement sometime in the future.  Questions were encouraged and answered at length.  Risk benefits of surgery which include but are not limited to DVT/PE, nerve vessel injury, wound healing problems, prolonged pain and possible leg length discrepancy all discussed with patient at length.  Questions were encouraged and answered by Joanne Jones myself.  Pamphlet on total hip arthroplasty was given.  She will call Joanne Jones our scheduler to set up surgery at near future.  Follow-up with [place] 2 weeks postop.   Follow-Up Instructions: Return After surgery.   Orders:  No orders of the defined types were placed in this encounter.  No orders of the defined types were placed in this encounter.     Procedures: No procedures performed   Clinical Data: No additional findings.   Subjective: Chief Complaint  Patient presents with  . Right Hip - Follow-up    HPI Joanne Jones returns today to go over her MRI of her right hip.  Again she is ahead right hip pain for some 2 years.  She has done physical therapy without  any real relief.  She states that anytime she does any significant activity like hiking or walking that she cannot really move for the next day due to the right hip pain.  She did receive a trochanteric injection by Joanne Jones at last office visit and this really gave her no significant relief. MRI right hip dated 10/08/2019 is reviewed with the patient and shows markedly advanced arthritis of the right hip with significant edema involving the femoral head and subchondral cyst formation.  No AVN.  Also tearing of the anterior and superior labrum was noted.  Review of Systems Negative for fevers chills shortness of breath chest pain.  Objective: Vital Signs: There were no vitals taken for this visit.  Physical Exam Constitutional:      Appearance: She is not ill-appearing or diaphoretic.  Pulmonary:     Effort: Pulmonary effort is normal.  Neurological:     Mental Status: She is alert and oriented to person, place, and time.  Psychiatric:        Mood and Affect: Mood normal.     Ortho Exam  Specialty Comments:  No specialty comments available.  Imaging: No results found.   PMFS History: Patient Active Problem List   Diagnosis Date Noted  . Primary osteoarthritis of right hip 10/30/2019  . Right hip pain 09/24/2017  . Right knee pain 09/24/2017  .  Encounter for routine gynecological examination 03/22/2016  . Preventative health care 05/23/2015  . Blepharitis 12/24/2014  . 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 Colon polyp 07/07/2013  . Diverticulosis 07/07/2013  . ASCUS favor benign 03/22/2013  . Post corneal transplant 03/21/2013  . Glaucoma suspect of both eyes 02/28/2013  . 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 Endometrial polyp 03/03/2011  . Astigmatism    Past Medical History:  Diagnosis Date  . Abnormal Pap smear and cervical HPV (human papillomavirus)    cervical cyst  . Astigmatism    S/P bialteral corneal transplants    Family  History  Problem Relation Age of Onset  . Cancer Maternal Grandmother        uterine  . Supraventricular tachycardia Mother        living  . Heart disease Father 4       hx of MV disease, LAD lesion, died of stroke complication of surgery  . Keratoconus Brother   . Hypertension Brother   . Anxiety disorder Brother     Past Surgical History:  Procedure Laterality Date  . BREAST ENHANCEMENT SURGERY  94  . corneal transp;lant  84   left eye  . CORNEAL TRANSPLANT  88   right eye  . removal of vulvar lipoma  1994   Social History   Occupational History  . Not on file  Tobacco Use  . Smoking status: Never Smoker  . Smokeless tobacco: Never Used  Substance and Sexual Activity  . Alcohol use: Yes    Alcohol/week: 14.0 standard drinks    Types: 14 Glasses of wine per week  . Drug use: No  . Sexual activity: Yes    Partners: Male    Birth control/protection: Implant

## 2019-11-06 ENCOUNTER — Other Ambulatory Visit: Payer: Self-pay

## 2019-12-20 NOTE — Progress Notes (Signed)
Need orders in epic.  Surgery on 9/10 preop on 9/3.

## 2019-12-20 NOTE — Progress Notes (Addendum)
DUE TO COVID-19 ONLY ONE VISITOR IS ALLOWED TO COME WITH YOU AND STAY IN THE WAITING ROOM ONLY DURING PRE OP AND PROCEDURE DAY OF SURGERY. THE 1 VISITOR  MAY VISIT WITH YOU AFTER SURGERY IN YOUR PRIVATE ROOM DURING VISITING HOURS ONLY!  YOU NEED TO HAVE A COVID 19 TEST ON__9/7/21 _____ @_______ , THIS TEST MUST BE DONE BEFORE SURGERY,  COVID TESTING SITE [address]  [id number], IT IS ON THE RIGHT GOING OUT [address] APPROXIMATELY  2 MINUTES PAST [employer] ON THE RIGHT. ONCE YOUR COVID TEST IS COMPLETED,  PLEASE BEGIN THE QUARANTINE INSTRUCTIONS AS OUTLINED IN YOUR HANDOUT.                HAZELYNN MCKENNY  12/20/2019   Your procedure is scheduled on: 12/28/19   Report to [facility] Main  Entrance   Report to admitting at  0700    AM     Call this number if you have problems the morning of surgery [phone]    REMEMBER: NO  SOLID FOOD CANDY OR GUM AFTER MIDNIGHT. CLEAR LIQUIDS UNTIL   0615am      . NOTHING BY MOUTH EXCEPT CLEAR LIQUIDS UNTIL    . PLEASE FINISH ENSURE DRINK PER SURGEON ORDER  WHICH NEEDS TO BE COMPLETED AT   0615am    .      CLEAR LIQUID DIET   Foods Allowed                                                                    Coffee and tea, regular and decaf                            Fruit ices (not with fruit pulp)                                      Iced Popsicles                                    Carbonated beverages, regular and diet                                    Cranberry, grape and apple juices Sports drinks like Gatorade Lightly seasoned clear broth or consume(fat free) Sugar, honey syrup ___________________________________________________________________      BRUSH YOUR TEETH MORNING OF SURGERY AND RINSE YOUR MOUTH OUT, NO CHEWING GUM CANDY OR MINTS.     Take these medicines the morning of surgery with A SIP OF WATER: none   DO NOT TAKE ANY DIABETIC MEDICATIONS DAY OF YOUR SURGERY                                You may not have any metal on your body including hair pins and              piercings  Do not wear  jewelry, make-up, lotions, powders or perfumes, deodorant             Do not wear nail polish on your fingernails.  Do not shave  48 hours prior to surgery.              Men may shave face and neck.   Do not bring valuables to the hospital. [facility].  Contacts, dentures or bridgework may not be worn into surgery.  Leave suitcase in the car. After surgery it may be brought to your room.     Patients discharged the day of surgery will not be allowed to drive home. IF YOU ARE HAVING SURGERY AND GOING HOME THE SAME DAY, YOU MUST HAVE AN ADULT TO DRIVE YOU HOME AND BE WITH YOU FOR 24 HOURS. YOU MAY GO HOME BY TAXI OR UBER OR ORTHERWISE, BUT AN ADULT MUST ACCOMPANY YOU HOME AND STAY WITH YOU FOR 24 HOURS.  Name and phone number of your driver:  Special Instructions: N/A              Please read over the following fact sheets you were given: _____________________________________________________________________  [facility] - Preparing for Surgery Before surgery, you can play an important role.  Because skin is not sterile, your skin needs to be as free of germs as possible.  You can reduce the number of germs on your skin by washing with CHG (chlorahexidine gluconate) soap before surgery.  CHG is an antiseptic cleaner which kills germs and bonds with the skin to continue killing germs even after washing. Please DO NOT use if you have an allergy to CHG or antibacterial soaps.  If your skin becomes reddened/irritated stop using the CHG and inform your nurse when you arrive at Short Stay. Do not shave (including legs and underarms) for at least 48 hours prior to the first CHG shower.  You may shave your face/neck. Please follow these instructions carefully:  1.  Shower with CHG Soap the night before surgery and the  morning of Surgery.  2.  If  you choose to wash your hair, wash your hair first as usual with your  normal  shampoo.  3.  After you shampoo, rinse your hair and body thoroughly to remove the  shampoo.                           4.  Use CHG as you would any other liquid soap.  You can apply chg directly  to the skin and wash                       Gently with a scrungie or clean washcloth.  5.  Apply the CHG Soap to your body ONLY FROM THE NECK DOWN.   Do not use on face/ open                           Wound or open sores. Avoid contact with eyes, ears mouth and genitals (private parts).                       Wash face,  Genitals (private parts) with your normal soap.             6.  Wash thoroughly, paying special  attention to the area where your surgery  will be performed.  7.  Thoroughly rinse your body with warm water from the neck down.  8.  DO NOT shower/wash with your normal soap after using and rinsing off  the CHG Soap.                9.  Pat yourself dry with a clean towel.            10.  Wear clean pajamas.            11.  Place clean sheets on your bed the night of your first shower and do not  sleep with pets. Day of Surgery : Do not apply any lotions/deodorants the morning of surgery.  Please wear clean clothes to the hospital/surgery center.  FAILURE TO FOLLOW THESE INSTRUCTIONS MAY RESULT IN THE CANCELLATION OF YOUR SURGERY PATIENT SIGNATURE_________________________________  NURSE SIGNATURE__________________________________  ________________________________________________________________________

## 2019-12-21 ENCOUNTER — Other Ambulatory Visit: Payer: Self-pay

## 2019-12-21 ENCOUNTER — Encounter (HOSPITAL_COMMUNITY): Payer: Self-pay

## 2019-12-21 ENCOUNTER — Encounter (HOSPITAL_COMMUNITY)
Admission: RE | Admit: 2019-12-21 | Discharge: 2019-12-21 | Disposition: A | Discharge status: Home / Self Care | Payer: 59 | Source: Ambulatory Visit | Attending: Orthopaedic Surgery | Admitting: Orthopaedic Surgery

## 2019-12-21 DIAGNOSIS — Z01812 Encounter for preprocedural laboratory examination: Secondary | ICD-10-CM | POA: Diagnosis not present

## 2019-12-21 LAB — CBC
HCT: 39.6 % (ref 36.0–46.0)
Hemoglobin: 13.2 g/dL (ref 12.0–15.0)
MCH: 30.6 pg (ref 26.0–34.0)
MCHC: 33.3 g/dL (ref 30.0–36.0)
MCV: 91.9 fL (ref 80.0–100.0)
Platelets: 280 10*3/uL (ref 150–400)
RBC: 4.31 MIL/uL (ref 3.87–5.11)
RDW: 13.2 % (ref 11.5–15.5)
WBC: 6 10*3/uL (ref 4.0–10.5)
nRBC: 0 % (ref 0.0–0.2)

## 2019-12-25 ENCOUNTER — Other Ambulatory Visit: Payer: Self-pay | Admitting: Physician Assistant

## 2019-12-26 ENCOUNTER — Telehealth: Payer: Self-pay | Admitting: *Deleted

## 2019-12-26 ENCOUNTER — Other Ambulatory Visit (HOSPITAL_COMMUNITY)
Admission: RE | Admit: 2019-12-26 | Discharge: 2019-12-26 | Disposition: A | Discharge status: Home / Self Care | Payer: 59 | Source: Ambulatory Visit | Attending: Orthopaedic Surgery | Admitting: Orthopaedic Surgery

## 2019-12-26 DIAGNOSIS — Z20822 Contact with and (suspected) exposure to covid-19: Secondary | ICD-10-CM | POA: Insufficient documentation

## 2019-12-26 DIAGNOSIS — Z01812 Encounter for preprocedural laboratory examination: Secondary | ICD-10-CM | POA: Diagnosis not present

## 2019-12-26 LAB — SARS CORONAVIRUS 2 (TAT 6-24 HRS): SARS Coronavirus 2: NEGATIVE

## 2019-12-26 NOTE — Telephone Encounter (Signed)
RNCM from office called patient to review her upcoming Right total hip arthroplasty on 12/28/19 with Dr. Ninfa Linden. She is agreeable to same day discharge after her surgery. She has a husband that will be assisting her at home. She has a FWW and 3in1/BSC at home. No DME needed. Choice provided and referral made to [facility] for [place] after discharge. She has a 2 week post-op appointment already scheduled for 01/10/20 at 1:15 pm in office with Dr. Ninfa Linden. Reviewed all post-op instructions and was able to answer questions related to surgery. Will follow for any needs.

## 2019-12-27 ENCOUNTER — Other Ambulatory Visit: Payer: Self-pay | Admitting: Orthopaedic Surgery

## 2019-12-27 MED ORDER — ASPIRIN 81 MG PO CHEW: 81.0000 mg | CHEWABLE_TABLET | Freq: Two times a day (BID) | ORAL | 0 refills | Status: DC

## 2019-12-27 MED ORDER — OXYCODONE HCL 5 MG PO TABS: 5.0000 mg | ORAL_TABLET | ORAL | 0 refills | Status: DC | PRN

## 2019-12-27 MED ORDER — ONDANSETRON 4 MG PO TBDP: 4.0000 mg | ORAL_TABLET | Freq: Three times a day (TID) | ORAL | 0 refills | Status: DC | PRN

## 2019-12-27 MED ORDER — METHOCARBAMOL 500 MG PO TABS: 500.0000 mg | ORAL_TABLET | Freq: Four times a day (QID) | ORAL | 1 refills | Status: DC | PRN

## 2019-12-27 MED FILL — METHOCARBAMOL 500 MG TABS: 500 | 10 days supply | Qty: 40 | Fill #0

## 2019-12-27 MED FILL — ASPIRIN LOW DOSE 81 MG CHEW: 81 | 15 days supply | Qty: 30 | Fill #0

## 2019-12-27 MED FILL — ONDANSETRON ODT 4 MG TABLET: 4 | 6 days supply | Qty: 20 | Fill #0

## 2019-12-27 MED FILL — oxyCODONE HCL 5 MG TABS: 5 | 5 days supply | Qty: 30 | Fill #0

## 2019-12-27 NOTE — Anesthesia Preprocedure Evaluation (Addendum)
Anesthesia Evaluation  Patient identified by MRN, date of birth, ID band Patient awake    Reviewed: Allergy & Precautions, NPO status , Patient's Chart, lab work & pertinent test results  History of Anesthesia Complications Negative for: history of anesthetic complications  Airway Mallampati: II  TM Distance: >3 FB Neck ROM: Full    Dental no notable dental hx. (+) Dental Advisory Given   Pulmonary neg pulmonary ROS,    Pulmonary exam normal        Cardiovascular negative cardio ROS Normal cardiovascular exam     Neuro/Psych negative neurological ROS     GI/Hepatic negative GI ROS, Neg liver ROS,   Endo/Other  negative endocrine ROS  Renal/GU negative Renal ROS     Musculoskeletal  (+) Arthritis ,   Abdominal   Peds  Hematology negative hematology ROS (+)   Anesthesia Other Findings   Reproductive/Obstetrics                            Anesthesia Physical Anesthesia Plan  ASA: I  Anesthesia Plan: Spinal   Post-op Pain Management:    Induction:   PONV Risk Score and Plan: 2 and Ondansetron, Propofol infusion and Dexamethasone  Airway Management Planned: Natural Airway and Simple Face Mask  Additional Equipment:   Intra-op Plan:   Post-operative Plan:   Informed Consent: I have reviewed the patients History and Physical, chart, labs and discussed the procedure including the risks, benefits and alternatives for the proposed anesthesia with the patient or authorized representative who has indicated his/her understanding and acceptance.     Dental advisory given  Plan Discussed with: Anesthesiologist and CRNA  Anesthesia Plan Comments:        Anesthesia Quick Evaluation

## 2019-12-27 NOTE — H&P (Signed)
TOTAL HIP ADMISSION H&P  Patient is admitted for right total hip arthroplasty.  Subjective:  Chief Complaint: right hip pain  HPI: Joanne Jones, 59 y.o. female, has a history of pain and functional disability in the right hip(s) due to arthritis and patient has failed non-surgical conservative treatments for greater than 12 weeks to include NSAID's and/or analgesics, corticosteriod injections, flexibility and strengthening excercises, weight reduction as appropriate and activity modification.  Onset of symptoms was gradual starting 2 years ago with gradually worsening course since that time.The patient noted no past surgery on the right hip(s).  Patient currently rates pain in the right hip at 10 out of 10 with activity. Patient has night pain, worsening of pain with activity and weight bearing, pain that interfers with activities of daily living and pain with passive range of motion. Patient has evidence of subchondral sclerosis, periarticular osteophytes and joint space narrowing by imaging studies. This condition presents safety issues increasing the risk of falls.  There is no current active infection.  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   Current Outpatient Medications  Medication Sig Dispense Refill Last Dose   diclofenac Sodium (VOLTAREN) 1 % GEL Apply 2 g topically 2 (two) times daily as needed (pain).      ibuprofen (ADVIL) 200 MG tablet Take 400 mg by mouth every 6 (six) hours as needed for moderate pain.      Multiple Vitamin (MULTIVITAMIN WITH MINERALS) TABS tablet Take 1 tablet by mouth daily.      aspirin (ASPIRIN CHILDRENS) 81 MG chewable tablet Chew 1 tablet (81 mg total) by mouth 2 (two) times daily after a meal. 30 tablet 0    methocarbamol (ROBAXIN) 500 MG tablet Take 1 tablet (500 mg total) by mouth every 6 (six) hours as needed for muscle spasms. 40 tablet 1    ondansetron (ZOFRAN ODT) 4 MG disintegrating tablet Take 1 tablet (4 mg total) by mouth every 8 (eight) hours as needed for nausea or vomiting. 20 tablet 0    oxyCODONE (ROXICODONE) 5 MG immediate release tablet Take 1 tablet (5 mg total) by mouth every 4 (four) hours as needed for severe pain. 30 tablet 0    No Known Allergies  Social History   Tobacco Use   Smoking status: Never Smoker   Smokeless tobacco: Never Used  Substance Use Topics   Alcohol use: Yes    Alcohol/week: 14.0 standard  drinks    Types: 14 Glasses of wine per week    Comment: GLASS OF WINE P[ERIODICASLLYH     Family History  Problem Relation Age of Onset   Cancer Maternal Grandmother        uterine   Supraventricular tachycardia Mother        living   Heart disease Father 35       hx of MV disease, LAD lesion, died of stroke complication of surgery   Keratoconus Brother    Hypertension Brother    Anxiety disorder Brother      Review of Systems  Respiratory: Positive for choking.   All other  systems reviewed and are negative.   Objective:  Physical Exam Vitals reviewed.  Constitutional:      Appearance: Normal appearance.  HENT:     Head: Normocephalic and atraumatic.  Eyes:     Pupils: Pupils are equal, round, and reactive to light.  Cardiovascular:     Rate and Rhythm: Normal rate and regular rhythm.  Pulmonary:     Effort: Pulmonary effort is normal.     Breath sounds: Normal breath sounds.  Abdominal:     Palpations: Abdomen is soft.  Musculoskeletal:     Cervical back: Normal range of motion and neck supple.     Right hip: Tenderness and bony tenderness present. Decreased range of motion. Decreased strength.  Neurological:     Mental Status: She is alert and oriented to person, place, and time.  Psychiatric:        Behavior: Behavior normal.     Vital signs in last 24 hours:    Labs:   Estimated body mass index is 22.35 kg/m as calculated from the following:   Height as of 12/21/19: 5\' 5"  (1.651 m).   Weight as of 12/21/19: 60.9 kg.   Imaging Review Plain radiographs demonstrate severe degenerative joint disease of the right hip(s). The bone quality appears to be excellent for age and reported activity level.      Assessment/Plan:  End stage arthritis, right hip(s)  The patient history, physical examination, clinical judgement of the provider and imaging studies are consistent with end stage degenerative joint disease of the right hip(s) and total hip arthroplasty is deemed medically necessary. The treatment options including medical management, injection therapy, arthroscopy and arthroplasty were discussed at length. The risks and benefits of total hip arthroplasty were presented and reviewed. The risks due to aseptic loosening, infection, stiffness, dislocation/subluxation,  thromboembolic complications and other imponderables were discussed.  The patient acknowledged the explanation, agreed to proceed with the plan and consent was signed. Patient  is being admitted for inpatient treatment for surgery, pain control, PT, OT, prophylactic antibiotics, VTE prophylaxis, progressive ambulation and ADL's and discharge planning.The patient is planning to be discharged home with home health services    Patient's anticipated LOS is less than 2 midnights, meeting these requirements: - Younger than 45 - Lives within 1 hour of care - Has a competent adult at home to recover with post-op recover - NO history of  - Chronic pain requiring opiods  - Diabetes  - Coronary Artery Disease  - Heart failure  - Heart attack  - Stroke  - DVT/VTE  - Cardiac arrhythmia  - Respiratory Failure/COPD  - Renal failure  - Anemia  - Advanced Liver disease

## 2019-12-28 ENCOUNTER — Encounter (HOSPITAL_COMMUNITY)
Admission: RE | Disposition: A | Discharge status: Home / Self Care | Payer: Self-pay | Source: Home / Self Care | Attending: Orthopaedic Surgery

## 2019-12-28 ENCOUNTER — Ambulatory Visit (HOSPITAL_COMMUNITY): Payer: 59 | Admitting: Anesthesiology

## 2019-12-28 ENCOUNTER — Ambulatory Visit (HOSPITAL_COMMUNITY): Payer: 59

## 2019-12-28 ENCOUNTER — Encounter (HOSPITAL_COMMUNITY): Payer: Self-pay | Admitting: Orthopaedic Surgery

## 2019-12-28 ENCOUNTER — Ambulatory Visit (HOSPITAL_COMMUNITY)
Admission: RE | Admit: 2019-12-28 | Discharge: 2019-12-28 | Disposition: A | Discharge status: Home / Self Care | Payer: 59 | Attending: Orthopaedic Surgery | Admitting: Orthopaedic Surgery

## 2019-12-28 DIAGNOSIS — K5790 Diverticulosis of intestine, part unspecified, without perforation or abscess without bleeding: Secondary | ICD-10-CM | POA: Diagnosis not present

## 2019-12-28 DIAGNOSIS — M1611 Unilateral primary osteoarthritis, right hip: Secondary | ICD-10-CM

## 2019-12-28 DIAGNOSIS — Z96641 Presence of right artificial hip joint: Secondary | ICD-10-CM

## 2019-12-28 DIAGNOSIS — Z7982 Long term (current) use of aspirin: Secondary | ICD-10-CM | POA: Insufficient documentation

## 2019-12-28 DIAGNOSIS — Z419 Encounter for procedure for purposes other than remedying health state, unspecified: Secondary | ICD-10-CM

## 2019-12-28 DIAGNOSIS — H52209 Unspecified astigmatism, unspecified eye: Secondary | ICD-10-CM | POA: Diagnosis not present

## 2019-12-28 DIAGNOSIS — Z471 Aftercare following joint replacement surgery: Secondary | ICD-10-CM | POA: Diagnosis not present

## 2019-12-28 HISTORY — PX: TOTAL HIP ARTHROPLASTY: SHX124

## 2019-12-28 LAB — SURGICAL PCR SCREEN
MRSA, PCR: NEGATIVE
Staphylococcus aureus: NEGATIVE

## 2019-12-28 LAB — TYPE AND SCREEN
ABO/RH(D): B POS
Antibody Screen: NEGATIVE

## 2019-12-28 LAB — ABO/RH: ABO/RH(D): B POS

## 2019-12-28 SURGERY — ARTHROPLASTY, HIP, TOTAL, ANTERIOR APPROACH
Anesthesia: Spinal | Site: Hip | Laterality: Right

## 2019-12-28 MED ORDER — 0.9 % SODIUM CHLORIDE (POUR BTL) OPTIME
TOPICAL | Status: DC | PRN
  Administered 2019-12-28: 1000 mL

## 2019-12-28 MED ORDER — PROPOFOL 10 MG/ML IV BOLUS
INTRAVENOUS | Status: DC | PRN
  Administered 2019-12-28 (×2): 20 mg via INTRAVENOUS

## 2019-12-28 MED ORDER — FENTANYL CITRATE (PF) 100 MCG/2ML IJ SOLN
INTRAMUSCULAR | Status: AC
  Filled 2019-12-28: qty 2

## 2019-12-28 MED ORDER — TRANEXAMIC ACID-NACL 1000-0.7 MG/100ML-% IV SOLN
1000.0000 mg | INTRAVENOUS | Status: AC
  Administered 2019-12-28: 1000 mg via INTRAVENOUS
  Filled 2019-12-28: qty 100

## 2019-12-28 MED ORDER — DEXAMETHASONE SODIUM PHOSPHATE 10 MG/ML IJ SOLN
INTRAMUSCULAR | Status: AC
  Filled 2019-12-28: qty 1

## 2019-12-28 MED ORDER — LACTATED RINGERS IV SOLN: INTRAVENOUS | Status: DC | PRN

## 2019-12-28 MED ORDER — CEFAZOLIN SODIUM-DEXTROSE 2-4 GM/100ML-% IV SOLN
2.0000 g | INTRAVENOUS | Status: AC
  Administered 2019-12-28: 2 g via INTRAVENOUS
  Filled 2019-12-28: qty 100

## 2019-12-28 MED ORDER — SODIUM CHLORIDE 0.9 % IR SOLN
Status: DC | PRN
  Administered 2019-12-28: 1000 mL

## 2019-12-28 MED ORDER — ACETAMINOPHEN 500 MG PO TABS
1000.0000 mg | ORAL_TABLET | Freq: Once | ORAL | Status: AC
  Administered 2019-12-28: 1000 mg via ORAL
  Filled 2019-12-28: qty 2

## 2019-12-28 MED ORDER — LACTATED RINGERS IV BOLUS
250.0000 mL | Freq: Once | INTRAVENOUS | Status: AC
  Administered 2019-12-28: 250 mL via INTRAVENOUS

## 2019-12-28 MED ORDER — METHOCARBAMOL 500 MG PO TABS: 500.0000 mg | ORAL_TABLET | Freq: Four times a day (QID) | ORAL | Status: DC | PRN

## 2019-12-28 MED ORDER — DEXAMETHASONE SODIUM PHOSPHATE 10 MG/ML IJ SOLN
INTRAMUSCULAR | Status: DC | PRN
  Administered 2019-12-28: 10 mg via INTRAVENOUS

## 2019-12-28 MED ORDER — CELECOXIB 200 MG PO CAPS
200.0000 mg | ORAL_CAPSULE | Freq: Once | ORAL | Status: AC
  Administered 2019-12-28: 200 mg via ORAL
  Filled 2019-12-28: qty 1

## 2019-12-28 MED ORDER — POVIDONE-IODINE 10 % EX SWAB
2.0000 "application " | Freq: Once | CUTANEOUS | Status: AC
  Administered 2019-12-28: 2 via TOPICAL

## 2019-12-28 MED ORDER — LACTATED RINGERS IV BOLUS
500.0000 mL | Freq: Once | INTRAVENOUS | Status: AC
  Administered 2019-12-28: 500 mL via INTRAVENOUS

## 2019-12-28 MED ORDER — FENTANYL CITRATE (PF) 100 MCG/2ML IJ SOLN
INTRAMUSCULAR | Status: AC
  Administered 2019-12-28: 50 ug via INTRAVENOUS
  Filled 2019-12-28: qty 2

## 2019-12-28 MED ORDER — LACTATED RINGERS IV SOLN
INTRAVENOUS | Status: DC
  Administered 2019-12-28: 1000 mL via INTRAVENOUS

## 2019-12-28 MED ORDER — METHOCARBAMOL 500 MG IVPB - SIMPLE MED
INTRAVENOUS | Status: AC
  Administered 2019-12-28: 500 mg via INTRAVENOUS
  Filled 2019-12-28: qty 50

## 2019-12-28 MED ORDER — CEFAZOLIN SODIUM-DEXTROSE 1-4 GM/50ML-% IV SOLN: 1.0000 g | Freq: Four times a day (QID) | INTRAVENOUS | Status: DC

## 2019-12-28 MED ORDER — ONDANSETRON HCL 4 MG/2ML IJ SOLN
INTRAMUSCULAR | Status: AC
  Filled 2019-12-28: qty 2

## 2019-12-28 MED ORDER — BUPIVACAINE IN DEXTROSE 0.75-8.25 % IT SOLN
INTRATHECAL | Status: DC | PRN
  Administered 2019-12-28: 1.6 mL via INTRATHECAL

## 2019-12-28 MED ORDER — METHOCARBAMOL 500 MG IVPB - SIMPLE MED: 500.0000 mg | Freq: Four times a day (QID) | INTRAVENOUS | Status: DC | PRN

## 2019-12-28 MED ORDER — STERILE WATER FOR IRRIGATION IR SOLN
Status: DC | PRN
  Administered 2019-12-28: 2000 mL

## 2019-12-28 MED ORDER — PROPOFOL 500 MG/50ML IV EMUL
INTRAVENOUS | Status: DC | PRN
  Administered 2019-12-28: 100 ug/kg/min via INTRAVENOUS

## 2019-12-28 MED ORDER — LIDOCAINE HCL (CARDIAC) PF 100 MG/5ML IV SOSY
PREFILLED_SYRINGE | INTRAVENOUS | Status: DC | PRN
  Administered 2019-12-28: 60 mg via INTRAVENOUS

## 2019-12-28 MED ORDER — PROPOFOL 1000 MG/100ML IV EMUL
INTRAVENOUS | Status: AC
  Filled 2019-12-28: qty 100

## 2019-12-28 MED ORDER — MIDAZOLAM HCL 2 MG/2ML IJ SOLN
INTRAMUSCULAR | Status: AC
  Filled 2019-12-28: qty 2

## 2019-12-28 MED ORDER — PHENYLEPHRINE HCL (PRESSORS) 10 MG/ML IV SOLN
INTRAVENOUS | Status: AC
  Filled 2019-12-28: qty 1

## 2019-12-28 MED ORDER — CHLORHEXIDINE GLUCONATE 0.12 % MT SOLN
15.0000 mL | OROMUCOSAL | Status: AC
  Administered 2019-12-28: 15 mL via OROMUCOSAL

## 2019-12-28 MED ORDER — FENTANYL CITRATE (PF) 100 MCG/2ML IJ SOLN
INTRAMUSCULAR | Status: DC | PRN
Start: 2019-12-28 — End: 2019-12-28
  Administered 2019-12-28: 100 ug via INTRAVENOUS

## 2019-12-28 MED ORDER — MIDAZOLAM HCL 5 MG/5ML IJ SOLN
INTRAMUSCULAR | Status: DC | PRN
  Administered 2019-12-28: 2 mg via INTRAVENOUS

## 2019-12-28 MED ORDER — LIDOCAINE 2% (20 MG/ML) 5 ML SYRINGE
INTRAMUSCULAR | Status: AC
  Filled 2019-12-28: qty 5

## 2019-12-28 MED ORDER — PROMETHAZINE HCL 25 MG/ML IJ SOLN: 6.2500 mg | INTRAMUSCULAR | Status: DC | PRN

## 2019-12-28 MED ORDER — FENTANYL CITRATE (PF) 100 MCG/2ML IJ SOLN: 25.0000 ug | INTRAMUSCULAR | Status: DC | PRN

## 2019-12-28 MED ORDER — ONDANSETRON HCL 4 MG/2ML IJ SOLN
INTRAMUSCULAR | Status: DC | PRN
  Administered 2019-12-28: 4 mg via INTRAVENOUS

## 2019-12-28 MED ORDER — PHENYLEPHRINE HCL-NACL 10-0.9 MG/250ML-% IV SOLN
INTRAVENOUS | Status: DC | PRN
  Administered 2019-12-28: 50 ug/min via INTRAVENOUS

## 2019-12-28 SURGICAL SUPPLY — 41 items
BAG ZIPLOCK 12X15 (MISCELLANEOUS) IMPLANT
BENZOIN TINCTURE PRP APPL 2/3 (GAUZE/BANDAGES/DRESSINGS) ×3 IMPLANT
BLADE SAW SGTL 18X1.27X75 (BLADE) ×2 IMPLANT
BLADE SAW SGTL 18X1.27X75MM (BLADE) ×1
CLOSURE WOUND 1/2 X4 (GAUZE/BANDAGES/DRESSINGS) ×1
COVER PERINEAL POST (MISCELLANEOUS) ×3 IMPLANT
COVER SURGICAL LIGHT HANDLE (MISCELLANEOUS) ×3 IMPLANT
COVER WAND RF STERILE (DRAPES) ×3 IMPLANT
CUP ACET PINNACLE SECTR 48MM (Joint) ×1 IMPLANT
DRAPE STERI IOBAN 125X83 (DRAPES) ×3 IMPLANT
DRAPE U-SHAPE 47X51 STRL (DRAPES) ×6 IMPLANT
DRSG AQUACEL AG ADV 3.5X10 (GAUZE/BANDAGES/DRESSINGS) ×3 IMPLANT
DURAPREP 26ML APPLICATOR (WOUND CARE) ×3 IMPLANT
ELECT REM PT RETURN 15FT ADLT (MISCELLANEOUS) ×3 IMPLANT
GAUZE XEROFORM 1X8 LF (GAUZE/BANDAGES/DRESSINGS) ×3 IMPLANT
GLOVE BIO SURGEON STRL SZ7.5 (GLOVE) ×3 IMPLANT
GLOVE BIOGEL PI IND STRL 8 (GLOVE) ×2 IMPLANT
GLOVE BIOGEL PI INDICATOR 8 (GLOVE) ×4
GLOVE ECLIPSE 8.0 STRL XLNG CF (GLOVE) ×3 IMPLANT
GOWN STRL REUS W/TWL XL LVL3 (GOWN DISPOSABLE) ×6 IMPLANT
HANDPIECE INTERPULSE COAX TIP (DISPOSABLE) ×2
HEAD FEMORAL 32 CERAMIC (Hips) ×3 IMPLANT
HOLDER FOLEY CATH W/STRAP (MISCELLANEOUS) IMPLANT
KIT TURNOVER KIT A (KITS) IMPLANT
LINER ACET 32X48 (Liner) ×3 IMPLANT
PACK ANTERIOR HIP CUSTOM (KITS) ×3 IMPLANT
PENCIL SMOKE EVACUATOR (MISCELLANEOUS) IMPLANT
PINNSECTOR W/GRIP ACE CUP 48MM (Joint) ×3 IMPLANT
SET HNDPC FAN SPRY TIP SCT (DISPOSABLE) ×1 IMPLANT
STAPLER VISISTAT 35W (STAPLE) IMPLANT
STEM CORAIL KA10 (Stem) ×3 IMPLANT
STRIP CLOSURE SKIN 1/2X4 (GAUZE/BANDAGES/DRESSINGS) ×2 IMPLANT
SUT ETHIBOND NAB CT1 #1 30IN (SUTURE) ×6 IMPLANT
SUT ETHILON 2 0 PS N (SUTURE) IMPLANT
SUT MNCRL AB 4-0 PS2 18 (SUTURE) ×3 IMPLANT
SUT VIC AB 0 CT1 36 (SUTURE) ×3 IMPLANT
SUT VIC AB 1 CT1 36 (SUTURE) ×3 IMPLANT
SUT VIC AB 2-0 CT1 27 (SUTURE) ×4
SUT VIC AB 2-0 CT1 TAPERPNT 27 (SUTURE) ×2 IMPLANT
TRAY CATH 16FR W/PLASTIC CATH (SET/KITS/TRAYS/PACK) ×3 IMPLANT
TRAY FOLEY MTR SLVR 16FR STAT (SET/KITS/TRAYS/PACK) IMPLANT

## 2019-12-28 NOTE — Anesthesia Postprocedure Evaluation (Signed)
Anesthesia Post Note  Patient: Joanne Jones  Procedure(s) Performed: RIGHT TOTAL HIP ARTHROPLASTY ANTERIOR APPROACH (Right Hip)     Patient location during evaluation: PACU Anesthesia Type: Spinal Level of consciousness: oriented and awake and alert Pain management: pain level controlled Vital Signs Assessment: post-procedure vital signs reviewed and stable Respiratory status: spontaneous breathing, respiratory function stable and patient connected to nasal cannula oxygen Cardiovascular status: blood pressure returned to baseline and stable Postop Assessment: no headache, no backache and no apparent nausea or vomiting Anesthetic complications: no   No complications documented.  Last Vitals:  Vitals:   12/28/19 1200 12/28/19 1215  BP: 102/67 98/64  Pulse: (!) 59 (!) 59  Resp: 13 13  Temp:    SpO2: 100% 100%    Last Pain:  Vitals:   12/28/19 1215  TempSrc:   PainSc: Asleep                 [name] DANIEL

## 2019-12-28 NOTE — Transfer of Care (Signed)
Immediate Anesthesia Transfer of Care Note  Patient: Joanne Jones  Procedure(s) Performed: RIGHT TOTAL HIP ARTHROPLASTY ANTERIOR APPROACH (Right Hip)  Patient Location: PACU  Anesthesia Type:Spinal  Level of Consciousness: awake, alert , oriented and patient cooperative  Airway & Oxygen Therapy: Patient Spontanous Breathing and Patient connected to face mask oxygen  Post-op Assessment: Report given to RN, Post -op Vital signs reviewed and stable and Patient moving all extremities  Post vital signs: Reviewed and stable  Last Vitals:  Vitals Value Taken Time  BP 105/68 12/28/19 1038  Temp 36.8 C 12/28/19 0717  Pulse 69 12/28/19 1041  Resp 13 12/28/19 1041  SpO2 100 % 12/28/19 1041  Vitals shown include unvalidated device data.  Last Pain:  Vitals:   12/28/19 0734  TempSrc:   PainSc: 0-No pain         Complications: No complications documented.

## 2019-12-28 NOTE — Evaluation (Signed)
Physical Therapy Evaluation Patient Details Name: Joanne Jones MRN: [id number] DOB: 11-12-1960 Today's Date: 12/28/2019   History of Present Illness  Patient is 59 y.o. female s/p Rt THA anterior approach on 12/28/19 with PMH significant for OA.    Clinical Impression  Joanne Jones is a 59 y.o. female POD 0 s/p Rt THA. Patient reports independence with mobility at baseline. Patient is now limited by functional impairments (see PT problem list below) and requires supervision for transfers and gait with RW. Patient was able to ambulate ~150 feet with RW and min guard/supervision and cues for safe walker management. Patient educated on safe sequencing for stair mobility and verbalized safe guarding position for people assisting with mobility. Patient instructed in exercises to facilitate ROM and circulation. Patient will benefit from continued skilled PT interventions to address impairments and progress towards PLOF. Patient has met mobility goals at adequate level for discharge home; will continue to follow if pt continues acute stay to progress towards Mod I goals.     Follow Up Recommendations Follow surgeon's recommendation for DC plan and follow-up therapies;Home health PT    Equipment Recommendations  None recommended by PT    Recommendations for Other Services       Precautions / Restrictions Precautions Precautions: Fall Restrictions Weight Bearing Restrictions: No Other Position/Activity Restrictions: WBAT      Mobility  Bed Mobility Overal bed mobility: Needs Assistance Bed Mobility: Supine to Sit;Sit to Supine     Supine to sit: Supervision Sit to supine: Supervision   General bed mobility comments: no assist or cues needed.   Transfers Overall transfer level: Needs assistance Equipment used: Rolling walker (2 wheeled) Transfers: Sit to/from Stand Sit to Stand: Supervision         General transfer comment: pt using UEs on RW for power up, no assist required, pt  steady.  Ambulation/Gait Ambulation/Gait assistance: Min guard;Supervision Gait Distance (Feet): 150 Feet Assistive device: Rolling walker (2 wheeled) Gait Pattern/deviations: Step-through pattern;Decreased stride length Gait velocity: decr   General Gait Details: cues for safe step pattern and proximity to RW, pt maintained throughout. no overt LOB noted.  Stairs Stairs: Yes Stairs assistance: Min guard;Supervision Stair Management: Two rails;One rail Right;One rail Left;Forwards;Step to pattern Number of Stairs: 6 General stair comments: cues for "up with the good, down with bad" pt usnig Bil rail and single rails to simulate home set up. No overt LOB , pt with good sequencing and verbalized understanding of guarding position for family to provide.  Wheelchair Mobility    Modified Rankin (Stroke Patients Only)       Balance Overall balance assessment: Needs assistance Sitting-balance support: Feet supported Sitting balance-Leahy Scale: Good     Standing balance support: During functional activity;Bilateral upper extremity supported Standing balance-Leahy Scale: Fair            Pertinent Vitals/Pain Pain Assessment: 0-10 Pain Score: 2  Pain Location: Rt hip Pain Descriptors / Indicators: Sore Pain Intervention(s): Limited activity within patient's tolerance;Monitored during session;Repositioned    Home Living Family/patient expects to be discharged to:: Private residence Living Arrangements: Spouse/significant other Available Help at Discharge: Family Type of Home: House Home Access: Stairs to enter Entrance Stairs-Rails: Left Entrance Stairs-Number of Steps: 2 Home Layout: Two level;1/2 bath on main level;Bed/bath upstairs Home Equipment: Bedside commode;Walker - 2 wheels      Prior Function Level of Independence: Independent               Hand Dominance  Dominant Hand: Right    Extremity/Trunk Assessment   Upper Extremity Assessment Upper  Extremity Assessment: Overall WFL for tasks assessed    Lower Extremity Assessment Lower Extremity Assessment: Overall WFL for tasks assessed    Cervical / Trunk Assessment Cervical / Trunk Assessment: Normal  Communication   Communication: No difficulties  Cognition Arousal/Alertness: Awake/alert Behavior During Therapy: WFL for tasks assessed/performed Overall Cognitive Status: Within Functional Limits for tasks assessed         General Comments      Exercises Total Joint Exercises Ankle Circles/Pumps: AROM;Both;Supine Quad Sets: AROM;Right;Supine;Other reps (comment) (2) Short Arc Quad: AROM;Right;Supine;Other reps (comment) (2) Heel Slides: AROM;Right;Supine;Other reps (comment) (2) Hip ABduction/ADduction: AROM;Right;Supine;Other reps (comment) (2) Long Arc Quad: AROM;Right;Supine;Other reps (comment) (3)   Assessment/Plan    PT Assessment Patient needs continued PT services  PT Problem List Decreased strength;Decreased range of motion;Decreased activity tolerance;Decreased balance;Decreased mobility;Decreased knowledge of use of DME;Decreased knowledge of precautions       PT Treatment Interventions DME instruction;Gait training;Stair training;Functional mobility training;Therapeutic activities;Therapeutic exercise;Balance training;Patient/family education    PT Goals (Current goals can be found in the Care Plan section)  Acute Rehab PT Goals Patient Stated Goal: get home and back to walking PT Goal Formulation: With patient Time For Goal Achievement: 01/04/20 Potential to Achieve Goals: Good    Frequency 7X/week   Barriers to discharge           AM-PAC PT "6 Clicks" Mobility  Outcome Measure Help needed turning from your back to your side while in a flat bed without using bedrails?: None Help needed moving from lying on your back to sitting on the side of a flat bed without using bedrails?: None Help needed moving to and from a bed to a chair (including  a wheelchair)?: A Little Help needed standing up from a chair using your arms (e.g., wheelchair or bedside chair)?: A Little Help needed to walk in hospital room?: A Little Help needed climbing 3-5 steps with a railing? : A Little 6 Click Score: 20    End of Session Equipment Utilized During Treatment: Gait belt Activity Tolerance: Patient tolerated treatment well Patient left: in bed;with call bell/phone within reach Nurse Communication: Mobility status PT Visit Diagnosis: Muscle weakness (generalized) (M62.81);Difficulty in walking, not elsewhere classified (R26.2)    Time: 1820-9906 PT Time Calculation (min) (ACUTE ONLY): 31 min   Charges:   PT Evaluation $PT Eval Low Complexity: 1 Low PT Treatments $Gait Training: 8-22 mins       Verner Mould, DPT Acute Rehabilitation Services  Office [phone] Pager [phone]  12/28/2019 3:36 PM

## 2019-12-28 NOTE — Interval H&P Note (Signed)
History and Physical Interval Note: The patient understands that she is here for a right total hip arthroplasty to treat the osteoarthritis of her right hip.  There has been no interval change in her health status.  See recent H&P.  The risk and benefits of surgery been discussed in detail and informed consent is obtained.  The right hip is been marked.  12/28/2019 8:53 AM  Joanne Jones  has presented today for surgery, with the diagnosis of right hip osteoarthritis.  The various methods of treatment have been discussed with the patient and family. After consideration of risks, benefits and other options for treatment, the patient has consented to  Procedure(s): RIGHT TOTAL HIP ARTHROPLASTY ANTERIOR APPROACH (Right) as a surgical intervention.  The patient's history has been reviewed, patient examined, no change in status, stable for surgery.  I have reviewed the patient's chart and labs.  Questions were answered to the patient's satisfaction.     Mcarthur Rossetti

## 2019-12-28 NOTE — Discharge Instructions (Signed)

## 2019-12-28 NOTE — Care Plan (Signed)
RNCM from office called patient to review her upcoming Right total hip arthroplasty on 12/28/19 with Dr. Ninfa Linden. She is agreeable to same day discharge after her surgery. She has a husband that will be assisting her at home. She has a FWW and 3in1/BSC at home. No DME needed. Choice provided and referral made to [facility] for [place] after discharge. She has a 2 week post-op appointment already scheduled for 01/10/20 at 1:15 pm in office with Dr. Ninfa Linden. Reviewed all post-op instructions and was able to answer questions related to surgery. RNCM from office will be happy to assist for any further needs. Please contact for questions/concerns. Jamse Arn, RN Case Manager [phone].

## 2019-12-28 NOTE — Op Note (Signed)
NAME: Joanne Jones, Joanne Jones. MEDICAL RECORD QM:[id number] ACCOUNT [id number] DATE OF BIRTH:1960/04/28 FACILITY: [facility] LOCATION: [facility] PH[facility]ICIAN:Holston Oyama Kerry Fort, MD  OPERATIVE REPORT  DATE OF PROCEDURE:  12/28/2019  PREOPERATIVE DIAGNOSIS:  Primary osteoarthritis and degenerative joint disease, right hip.  POSTOPERATIVE DIAGNOSIS:  Primary osteoarthritis and degenerative joint disease, right hip.  PROCEDURE:  Right total hip arthroplasty through direct anterior approach.  IMPLANTS:  DePuy Sector Gription acetabular component size 48, size 32+0 neutral polyethylene liner, size 10 Corail femoral component with standard offset, size 32+1 ceramic hip ball.  SURGEON:  Lind Guest.  Ninfa Linden, MD  ASSISTANT:  Erskine Emery, PA-C.  ANESTHESIA:  Spinal.  ANTIBIOTICS:  Two g IV Ancef.  ESTIMATED BLOOD LOSS:  200 mL.  COMPLICATIONS:  None.  INDICATIONS:  The patient is a very active 59 year old female with debilitating arthritis involving her right hip.  This has been well documented with radiographic studies and physical exam.  At this point, she has tried and failed all forms of  conservative treatment.  Her right hip pain is daily and it is detrimentally affecting her mobility, quality of life, and her activities of daily living.  She is at the point where she does wish to proceed with a total hip arthroplasty.  With this type  of surgery, we have explained in detail the risk of acute blood loss anemia, nerve or vessel injury, fracture, infection, dislocation, DVT and implant failure, as well as skin and soft tissue issues.  We talked about our goals being decreased pain,  improved mobility and overall improved quality of life.  DESCRIPTION OF PROCEDURE:  After informed consent was obtained, the appropriate right hip was marked.  She was brought to the operating room and sat up on a stretcher.  Spinal anesthesia was obtained.  She was then laid in supine position on a stretcher.   I  assessed her leg lengths and found them to be equal.  Traction boots were placed on both her feet.  Next, she was placed supine on the Hana fracture table with the perineal post in place and both legs in line skeletal traction device and no traction  applied.  Her right operative hip was prepped and draped with DuraPrep and sterile drapes.  Time-out was called and she was identified as correct patient, correct right hip.  I then made an incision just inferior and posterior to the anterior superior  iliac spine and carried this obliquely down the leg.  We dissected down to the tensor fascia lata muscle.  The tensor fascia was then divided longitudinally to proceed with direct anterior approach to the hip.  We identified and cauterized circumflex  vessels, then identified the hip capsule, opened up the hip capsule in an L-type format, finding a moderate joint effusion and significant periarticular osteophytes around the femoral head and neck.  With Cobra retractors within the joint capsule, I made  our femoral neck cut with an oscillating saw just proximal to the lesser trochanter and completed this with an osteotome.  We placed a corkscrew guide in the femoral head and removed the femoral head in its entirety and found a wide area with  significant cartilage loss.  I then placed a bent Hohmann over the medial acetabular rim and removed remnants of the acetabular labrum and other debris.  We then began reaming under direct visualization from a size 43 reamer in stepwise increments up to  a size 47, with all reamers placed under direct visualization, the last reamer was placed under  direct fluoroscopy, so we could obtain our depth of reaming, our inclination and anteversion.  I then placed the real DePuy Sector Gription acetabular  component size 48.  I had a little bit hard time getting ____ , backed the cup out and removed more soft tissue from around the rim and then I ended up reaming 1 more millimeter to 48  reamer for line-to-line reaming.  I then placed the real DePuy Sector  Gription acetabular component size 48 again in the hip and it was nice and stable and tight.  I verified this placement under direct fluoroscopy and visualization.  I then placed a 32+0 neutral polyethylene liner for that size acetabular component based  on her offset and leg lengths.  I then had the leg externally rotated to 120 degrees, extended and adducted.  I was able to place a Mueller retractor medially and a Hohmann retractor above the greater trochanter.  I released the lateral joint capsule and  used a box-cutting osteotome to enter the femoral canal and a rongeur to lateralize.  I then began using the Corail broaching system from a size 8, going up to a size 10.  With a size 10 in place, we trialed a standard offset femoral neck and a 32+1  trial hip ball.  We brought the leg back over and up and with traction and internal rotation, reducing the pelvis and we were pleased with leg length, offset, range of motion and stability assessed mechanically and radiographically.  We then dislocated  the hip and removed the trial components.  I placed the real Corail femoral component size 10 with standard offset and the real 32+1 ceramic hip ball and again reduced this in the acetabulum.  We were pleased with stability, leg length, offset and range  of motion.  We then irrigated the soft tissue with normal saline solution using pulsatile lavage.  We closed the joint capsule with interrupted #1 Ethibond suture, followed by closing the tensor fascia with #1 Vicryl, 0 Vicryl was used to close the deep  tissue and 2-0 Vicryl was used to close subcutaneous tissue, a 4-0 Monocryl subcuticular stitch was placed and then Steri-Strips to reapproximate the skin.  An Aquacel dressing was placed.  The patient was then taken off the Hana table and taken to  recovery room in stable condition.  All final counts were correct.  There were no  complications noted.  Of note, Benita Stabile, PA-C, assisted during the entire case and his assistance was crucial for facilitating all aspects of this case.  VN/NUANCE  D:12/28/2019 T:12/28/2019 JOB:[id number]

## 2019-12-28 NOTE — Brief Op Note (Signed)
12/28/2019  10:15 AM  PATIENT:  Joanne Jones  59 y.o. female  PRE-OPERATIVE DIAGNOSIS:  right hip osteoarthritis  POST-OPERATIVE DIAGNOSIS:  right hip osteoarthritis  PROCEDURE:  Procedure(s): RIGHT TOTAL HIP ARTHROPLASTY ANTERIOR APPROACH (Right)  SURGEON:  Surgeon(s) and Role:    Mcarthur Rossetti, MD - Primary  PHYSICIAN ASSISTANT:  Benita Stabile, PA-C  ANESTHESIA:   spinal  EBL:  200 mL   COUNTS:  YES   DICTATION: .Other Dictation: Dictation Number [id number]  PLAN OF CARE: Discharge to home after PACU  PATIENT DISPOSITION:  PACU - hemodynamically stable.   Delay start of Pharmacological VTE agent (>24hrs) due to surgical blood loss or risk of bleeding: no

## 2019-12-28 NOTE — Anesthesia Procedure Notes (Signed)
Spinal  Patient location during procedure: OR Start time: 12/28/2019 9:06 AM End time: 12/28/2019 9:13 AM Staffing Performed: anesthesiologist  Anesthesiologist: Duane Boston, MD Preanesthetic Checklist Completed: patient identified, IV checked, risks and benefits discussed, surgical consent, monitors and equipment checked, pre-op evaluation and timeout performed Spinal Block Patient position: sitting Prep: DuraPrep Patient monitoring: cardiac monitor, continuous pulse ox and blood pressure Approach: midline Location: L2-3 Injection technique: single-shot Needle Needle type: Pencan  Needle gauge: 24 G Needle length: 9 cm Additional Notes Functioning IV was confirmed and monitors were applied. Sterile prep and drape, including hand hygiene and sterile gloves were used. The patient was positioned and the spine was prepped. The skin was anesthetized with lidocaine.  Free flow of clear CSF was obtained prior to injecting local anesthetic into the CSF.  The spinal needle aspirated freely following injection.  The needle was carefully withdrawn.  The patient tolerated the procedure well.

## 2019-12-29 DIAGNOSIS — K635 Polyp of colon: Secondary | ICD-10-CM | POA: Diagnosis not present

## 2019-12-29 DIAGNOSIS — H01006 Unspecified blepharitis left eye, unspecified eyelid: Secondary | ICD-10-CM | POA: Diagnosis not present

## 2019-12-29 DIAGNOSIS — H01003 Unspecified blepharitis right eye, unspecified eyelid: Secondary | ICD-10-CM | POA: Diagnosis not present

## 2019-12-29 DIAGNOSIS — N840 Polyp of corpus uteri: Secondary | ICD-10-CM | POA: Diagnosis not present

## 2019-12-29 DIAGNOSIS — H18603 Keratoconus, unspecified, bilateral: Secondary | ICD-10-CM | POA: Diagnosis not present

## 2019-12-29 DIAGNOSIS — Z471 Aftercare following joint replacement surgery: Secondary | ICD-10-CM | POA: Diagnosis not present

## 2019-12-29 DIAGNOSIS — Z96641 Presence of right artificial hip joint: Secondary | ICD-10-CM | POA: Diagnosis not present

## 2019-12-29 DIAGNOSIS — H04121 Dry eye syndrome of right lacrimal gland: Secondary | ICD-10-CM | POA: Diagnosis not present

## 2019-12-29 DIAGNOSIS — K5790 Diverticulosis of intestine, part unspecified, without perforation or abscess without bleeding: Secondary | ICD-10-CM | POA: Diagnosis not present

## 2019-12-31 ENCOUNTER — Encounter (HOSPITAL_COMMUNITY): Payer: Self-pay | Admitting: Orthopaedic Surgery

## 2019-12-31 DIAGNOSIS — Z96641 Presence of right artificial hip joint: Secondary | ICD-10-CM | POA: Diagnosis not present

## 2019-12-31 DIAGNOSIS — H01003 Unspecified blepharitis right eye, unspecified eyelid: Secondary | ICD-10-CM | POA: Diagnosis not present

## 2019-12-31 DIAGNOSIS — H04121 Dry eye syndrome of right lacrimal gland: Secondary | ICD-10-CM | POA: Diagnosis not present

## 2019-12-31 DIAGNOSIS — H18603 Keratoconus, unspecified, bilateral: Secondary | ICD-10-CM | POA: Diagnosis not present

## 2019-12-31 DIAGNOSIS — K5790 Diverticulosis of intestine, part unspecified, without perforation or abscess without bleeding: Secondary | ICD-10-CM | POA: Diagnosis not present

## 2019-12-31 DIAGNOSIS — Z471 Aftercare following joint replacement surgery: Secondary | ICD-10-CM | POA: Diagnosis not present

## 2019-12-31 DIAGNOSIS — N840 Polyp of corpus uteri: Secondary | ICD-10-CM | POA: Diagnosis not present

## 2019-12-31 DIAGNOSIS — H01006 Unspecified blepharitis left eye, unspecified eyelid: Secondary | ICD-10-CM | POA: Diagnosis not present

## 2019-12-31 DIAGNOSIS — K635 Polyp of colon: Secondary | ICD-10-CM | POA: Diagnosis not present

## 2020-01-02 DIAGNOSIS — H04121 Dry eye syndrome of right lacrimal gland: Secondary | ICD-10-CM | POA: Diagnosis not present

## 2020-01-02 DIAGNOSIS — K635 Polyp of colon: Secondary | ICD-10-CM | POA: Diagnosis not present

## 2020-01-02 DIAGNOSIS — H01003 Unspecified blepharitis right eye, unspecified eyelid: Secondary | ICD-10-CM | POA: Diagnosis not present

## 2020-01-02 DIAGNOSIS — Z96641 Presence of right artificial hip joint: Secondary | ICD-10-CM | POA: Diagnosis not present

## 2020-01-02 DIAGNOSIS — H18603 Keratoconus, unspecified, bilateral: Secondary | ICD-10-CM | POA: Diagnosis not present

## 2020-01-02 DIAGNOSIS — Z471 Aftercare following joint replacement surgery: Secondary | ICD-10-CM | POA: Diagnosis not present

## 2020-01-02 DIAGNOSIS — H01006 Unspecified blepharitis left eye, unspecified eyelid: Secondary | ICD-10-CM | POA: Diagnosis not present

## 2020-01-02 DIAGNOSIS — N840 Polyp of corpus uteri: Secondary | ICD-10-CM | POA: Diagnosis not present

## 2020-01-02 DIAGNOSIS — K5790 Diverticulosis of intestine, part unspecified, without perforation or abscess without bleeding: Secondary | ICD-10-CM | POA: Diagnosis not present

## 2020-01-03 DIAGNOSIS — H01003 Unspecified blepharitis right eye, unspecified eyelid: Secondary | ICD-10-CM | POA: Diagnosis not present

## 2020-01-03 DIAGNOSIS — K5790 Diverticulosis of intestine, part unspecified, without perforation or abscess without bleeding: Secondary | ICD-10-CM | POA: Diagnosis not present

## 2020-01-03 DIAGNOSIS — H01006 Unspecified blepharitis left eye, unspecified eyelid: Secondary | ICD-10-CM | POA: Diagnosis not present

## 2020-01-03 DIAGNOSIS — H18603 Keratoconus, unspecified, bilateral: Secondary | ICD-10-CM | POA: Diagnosis not present

## 2020-01-03 DIAGNOSIS — K635 Polyp of colon: Secondary | ICD-10-CM | POA: Diagnosis not present

## 2020-01-03 DIAGNOSIS — N840 Polyp of corpus uteri: Secondary | ICD-10-CM | POA: Diagnosis not present

## 2020-01-03 DIAGNOSIS — Z96641 Presence of right artificial hip joint: Secondary | ICD-10-CM | POA: Diagnosis not present

## 2020-01-03 DIAGNOSIS — H04121 Dry eye syndrome of right lacrimal gland: Secondary | ICD-10-CM | POA: Diagnosis not present

## 2020-01-03 DIAGNOSIS — Z471 Aftercare following joint replacement surgery: Secondary | ICD-10-CM | POA: Diagnosis not present

## 2020-01-08 ENCOUNTER — Encounter: Payer: Self-pay | Admitting: Orthopaedic Surgery

## 2020-01-10 ENCOUNTER — Encounter: Payer: Self-pay | Admitting: Orthopaedic Surgery

## 2020-01-10 ENCOUNTER — Ambulatory Visit (INDEPENDENT_AMBULATORY_CARE_PROVIDER_SITE_OTHER): Payer: 59 | Admitting: Orthopaedic Surgery

## 2020-01-10 DIAGNOSIS — Z96641 Presence of right artificial hip joint: Secondary | ICD-10-CM | POA: Insufficient documentation

## 2020-01-10 NOTE — Progress Notes (Signed)
Joanne Jones comes in today at almost 2 weeks status post a right total hip arthroplasty.  She is doing well overall.  She is walking without an assistive device and not taking pain medications.  She has been taking her baby aspirin twice a day.  On exam she does have a moderate seroma.  Incision looks great and remove the old Steri-Strips in place new Steri-Strips.  I was able to aspirate about 80 cc of fluid off of the soft tissue.  We will have her just take 1 aspirin a day for the next week and then she can stop her aspirin.  I really do not need to see her back for 4 weeks unless there is any issues.  However, if she does have a significant reaccumulation of her seroma she knows to call us and get worked in for another aspiration.  All question concerns were answered and addressed.

## 2020-01-11 DIAGNOSIS — Z471 Aftercare following joint replacement surgery: Secondary | ICD-10-CM | POA: Diagnosis not present

## 2020-01-11 DIAGNOSIS — N840 Polyp of corpus uteri: Secondary | ICD-10-CM | POA: Diagnosis not present

## 2020-01-11 DIAGNOSIS — H18603 Keratoconus, unspecified, bilateral: Secondary | ICD-10-CM | POA: Diagnosis not present

## 2020-01-11 DIAGNOSIS — H01006 Unspecified blepharitis left eye, unspecified eyelid: Secondary | ICD-10-CM | POA: Diagnosis not present

## 2020-01-11 DIAGNOSIS — K635 Polyp of colon: Secondary | ICD-10-CM | POA: Diagnosis not present

## 2020-01-11 DIAGNOSIS — H04121 Dry eye syndrome of right lacrimal gland: Secondary | ICD-10-CM | POA: Diagnosis not present

## 2020-01-11 DIAGNOSIS — H01003 Unspecified blepharitis right eye, unspecified eyelid: Secondary | ICD-10-CM | POA: Diagnosis not present

## 2020-01-11 DIAGNOSIS — Z96641 Presence of right artificial hip joint: Secondary | ICD-10-CM | POA: Diagnosis not present

## 2020-01-11 DIAGNOSIS — K5790 Diverticulosis of intestine, part unspecified, without perforation or abscess without bleeding: Secondary | ICD-10-CM | POA: Diagnosis not present

## 2020-01-14 ENCOUNTER — Encounter: Payer: Self-pay | Admitting: Orthopaedic Surgery

## 2020-02-07 ENCOUNTER — Ambulatory Visit (INDEPENDENT_AMBULATORY_CARE_PROVIDER_SITE_OTHER): Payer: 59 | Admitting: Orthopaedic Surgery

## 2020-02-07 DIAGNOSIS — Z96641 Presence of right artificial hip joint: Secondary | ICD-10-CM

## 2020-02-07 NOTE — Progress Notes (Signed)
Joanne Jones is now about 6-week status post a right total hip arthroplasty direct under approach.  He is walking without assistive device.  There is a little bit of tightness and swelling but overall she is doing great.  She has no limp when she walks as well.  On examination right hip moves smoothly and fluidly.  Her incision is healed nicely.  There is no issues with the left leg lengths either from what I can tell.  She is thus far pleased.  From my standpoint we will see her back in 6 months with a standing low AP pelvis and lateral right operative hip.  She knows as well.  If there is any issues before then he will let us know.

## 2020-03-20 ENCOUNTER — Encounter: Payer: Self-pay | Admitting: Orthopaedic Surgery

## 2020-04-08 ENCOUNTER — Ambulatory Visit: Payer: 59 | Admitting: Family

## 2020-04-08 ENCOUNTER — Encounter: Payer: Self-pay | Admitting: Family

## 2020-04-08 ENCOUNTER — Ambulatory Visit (HOSPITAL_COMMUNITY)
Admission: EM | Admit: 2020-04-08 | Discharge: 2020-04-08 | Disposition: A | Discharge status: Home / Self Care | Payer: 59 | Attending: Urgent Care | Admitting: Urgent Care

## 2020-04-08 ENCOUNTER — Other Ambulatory Visit: Payer: Self-pay

## 2020-04-08 ENCOUNTER — Encounter (HOSPITAL_COMMUNITY): Payer: Self-pay

## 2020-04-08 DIAGNOSIS — J029 Acute pharyngitis, unspecified: Secondary | ICD-10-CM | POA: Insufficient documentation

## 2020-04-08 LAB — POCT RAPID STREP A, ED / UC: Streptococcus, Group A Screen (Direct): NEGATIVE

## 2020-04-08 NOTE — ED Provider Notes (Signed)
[place]   MRN: [id number] DOB: 17-Aug-1960  Subjective:   Joanne Jones is a 59 y.o. female presenting for 3-day history of acute onset throat pain, hoarseness.  Patient got Covid tested and was negative yesterday.  She would like to make sure that she does not have strep throat.  No current facility-administered medications for this encounter.  Current Outpatient Medications:  .  aspirin (ASPIRIN CHILDRENS) 81 MG chewable tablet, Chew 1 tablet (81 mg total) by mouth 2 (two) times daily after a meal., Disp: 30 tablet, Rfl: 0 .  diclofenac Sodium (VOLTAREN) 1 % GEL, Apply 2 g topically 2 (two) times daily as needed (pain)., Disp: , Rfl:  .  ibuprofen (ADVIL) 200 MG tablet, Take 400 mg by mouth every 6 (six) hours as needed for moderate pain., Disp: , Rfl:  .  methocarbamol (ROBAXIN) 500 MG tablet, Take 1 tablet (500 mg total) by mouth every 6 (six) hours as needed for muscle spasms., Disp: 40 tablet, Rfl: 1 .  Multiple Vitamin (MULTIVITAMIN WITH MINERALS) TABS tablet, Take 1 tablet by mouth daily., Disp: , Rfl:  .  ondansetron (ZOFRAN ODT) 4 MG disintegrating tablet, Take 1 tablet (4 mg total) by mouth every 8 (eight) hours as needed for nausea or vomiting., Disp: 20 tablet, Rfl: 0 .  oxyCODONE (ROXICODONE) 5 MG immediate release tablet, Take 1 tablet (5 mg total) by mouth every 4 (four) hours as needed for severe pain., Disp: 30 tablet, Rfl: 0   No Known Allergies  Past Medical History:  Diagnosis Date  . Abnormal Pap smear and cervical HPV (human papillomavirus)    cervical cyst  . Astigmatism    S/P bialteral corneal transplants     Past Surgical History:  Procedure Laterality Date  . BREAST ENHANCEMENT SURGERY  94  . corneal transp;lant  84   left eye  . CORNEAL TRANSPLANT  88   right eye  . removal of vulvar lipoma  1994  . TOTAL HIP ARTHROPLASTY Right 12/28/2019   Procedure: RIGHT TOTAL HIP ARTHROPLASTY ANTERIOR APPROACH;  Surgeon: Mcarthur Rossetti, MD;  Location: [facility];  Service: Orthopedics;  Laterality: Right;    Family History  Problem Relation Age of Onset  . Cancer Maternal Grandmother        uterine  . Supraventricular tachycardia Mother        living  . Heart disease Father 62       hx of MV disease, LAD lesion, died of stroke complication of surgery  . Keratoconus Brother   . Hypertension Brother   . Anxiety disorder Brother     Social History   Tobacco Use  . Smoking status: Never Smoker  . Smokeless tobacco: Never Used  Vaping Use  . Vaping Use: Never used  Substance Use Topics  . Alcohol use: Yes    Alcohol/week: 14.0 standard drinks    Types: 14 Glasses of wine per week    Comment: GLASS OF WINE P[ERIODICASLLYH   . Drug use: No    ROS   Objective:   Vitals: BP 118/76 (BP Location: Right Arm)   Pulse 76   Temp 97.6 F (36.4 C)   Resp 18   LMP 03/20/2015   SpO2 100%   Physical Exam Constitutional:      General: She is not in acute distress.    Appearance: Normal appearance. She is well-developed. She is not ill-appearing, toxic-appearing or diaphoretic.  HENT:     Head:  Normocephalic and atraumatic.     Nose: Nose normal.     Mouth/Throat:     Mouth: Mucous membranes are moist.     Pharynx: No pharyngeal swelling, oropharyngeal exudate, posterior oropharyngeal erythema or uvula swelling.     Comments: Slight postnasal drainage overlying pharynx. Eyes:     General: No scleral icterus.    Extraocular Movements: Extraocular movements intact.     Pupils: Pupils are equal, round, and reactive to light.  Cardiovascular:     Rate and Rhythm: Normal rate.  Pulmonary:     Effort: Pulmonary effort is normal.  Skin:    General: Skin is warm and dry.  Neurological:     General: No focal deficit present.     Mental Status: She is alert and oriented to person, place, and time.  Psychiatric:        Mood and Affect: Mood normal.        Behavior: Behavior normal.     Results  for orders placed or performed during the hospital encounter of 04/08/20 (from the past 24 hour(s))  POCT Rapid Strep A     Status: None   Collection Time: 04/08/20 10:11 AM  Result Value Ref Range   Streptococcus, Group A Screen (Direct) NEGATIVE NEGATIVE    Assessment and Plan :   PDMP not reviewed this encounter.  1. Pharyngitis, unspecified etiology     Recommended supportive care for viral pharyngitis.  Strep culture pending. Counseled patient on potential for adverse effects with medications prescribed/recommended today, ER and return-to-clinic precautions discussed, patient verbalized understanding.    Jaynee Eagles, PA-C 04/08/20 1030

## 2020-04-08 NOTE — ED Triage Notes (Signed)
Pt presents with ore throat x 4 days. Reports negative COVID test. Denies fever or any other  symptoms.

## 2020-04-09 ENCOUNTER — Other Ambulatory Visit: Payer: Self-pay | Admitting: Family

## 2020-04-09 MED ORDER — IBUPROFEN 600 MG PO TABS: 600.0000 mg | ORAL_TABLET | Freq: Three times a day (TID) | ORAL | 0 refills | Status: DC | PRN

## 2020-04-09 MED ORDER — LIDOCAINE VISCOUS HCL 2 % MT SOLN: 10.0000 mL | Freq: Four times a day (QID) | OROMUCOSAL | 0 refills | Status: DC | PRN

## 2020-04-09 MED FILL — LIDOCAINE 2% VISCOUS SOLN: 2 | 5 days supply | Qty: 200 | Fill #0

## 2020-04-09 MED FILL — IBUPROFEN 600 MG TABLET: 600 | 6 days supply | Qty: 20 | Fill #0

## 2020-04-10 LAB — CULTURE, GROUP A STREP (THRC)

## 2020-04-14 ENCOUNTER — Other Ambulatory Visit: Payer: Self-pay | Admitting: Family

## 2020-04-14 MED ORDER — CIPROFLOXACIN HCL 0.3 % OP SOLN: OPHTHALMIC | 0 refills | Status: DC

## 2020-04-14 MED FILL — CIPROFLOXACIN 0.3% EYE DRO: 0.3 | 7 days supply | Qty: 5 | Fill #0

## 2020-04-14 NOTE — Addendum Note (Signed)
Addended by: Sandford Craze on: 04/14/2020 10:26 AM   Modules accepted: Orders

## 2020-05-12 ENCOUNTER — Ambulatory Visit: Payer: 59 | Attending: Internal Medicine

## 2020-05-12 ENCOUNTER — Other Ambulatory Visit (HOSPITAL_BASED_OUTPATIENT_CLINIC_OR_DEPARTMENT_OTHER): Payer: Self-pay | Admitting: Internal Medicine

## 2020-05-12 DIAGNOSIS — Z23 Encounter for immunization: Secondary | ICD-10-CM

## 2020-05-12 MED FILL — PFIZER-BIONTECH COVID-19 VA: 30 | 21 days supply | Qty: 0 | Fill #0

## 2020-05-12 NOTE — Progress Notes (Signed)
   Covid-19 Vaccination Clinic  Name:  Joanne Jones    MRN: [id number] DOB: May 23, 1960  05/12/2020  Joanne Jones was observed post Covid-19 immunization for 15 minutes without incident. She was provided with Vaccine Information Sheet and instruction to access the V-Safe system.   Joanne Jones was instructed to call 911 with any severe reactions post vaccine: Marland Kitchen Difficulty breathing  . Swelling of face and throat  . A fast heartbeat  . A bad rash all over body  . Dizziness and weakness   Immunizations Administered    Name Date Dose VIS Date Route   PFIZER Comrnaty(Gray TOP) Covid-19 Vaccine 05/12/2020  2:45 PM 0.3 mL 03/27/2020 Intramuscular   Manufacturer: [place]   Lot: [id number]   NDC: [phone]

## 2020-06-03 DIAGNOSIS — Z947 Corneal transplant status: Secondary | ICD-10-CM | POA: Diagnosis not present

## 2020-06-03 DIAGNOSIS — H40013 Open angle with borderline findings, low risk, bilateral: Secondary | ICD-10-CM | POA: Diagnosis not present

## 2020-07-04 ENCOUNTER — Ambulatory Visit: Payer: 59 | Admitting: Family

## 2020-07-04 ENCOUNTER — Encounter: Payer: Self-pay | Admitting: Family

## 2020-07-04 ENCOUNTER — Other Ambulatory Visit: Payer: Self-pay

## 2020-07-04 VITALS — BP 114/80 | HR 72 | Temp 98.4°F | Resp 16 | Ht 64.0 in | Wt 136.0 lb

## 2020-07-04 DIAGNOSIS — Z23 Encounter for immunization: Secondary | ICD-10-CM | POA: Diagnosis not present

## 2020-07-04 DIAGNOSIS — Z0000 Encounter for general adult medical examination without abnormal findings: Secondary | ICD-10-CM | POA: Diagnosis not present

## 2020-07-04 DIAGNOSIS — Z862 Personal history of diseases of the blood and blood-forming organs and certain disorders involving the immune mechanism: Secondary | ICD-10-CM

## 2020-07-04 DIAGNOSIS — E785 Hyperlipidemia, unspecified: Secondary | ICD-10-CM

## 2020-07-04 LAB — CBC WITH DIFFERENTIAL/PLATELET
Basophils Absolute: 0.1 10*3/uL (ref 0.0–0.1)
Basophils Relative: 1.1 % (ref 0.0–3.0)
Eosinophils Absolute: 0.1 10*3/uL (ref 0.0–0.7)
Eosinophils Relative: 2.9 % (ref 0.0–5.0)
HCT: 37.1 % (ref 36.0–46.0)
Hemoglobin: 12.5 g/dL (ref 12.0–15.0)
Lymphocytes Relative: 40.5 % (ref 12.0–46.0)
Lymphs Abs: 2 10*3/uL (ref 0.7–4.0)
MCHC: 33.8 g/dL (ref 30.0–36.0)
MCV: 88.6 fl (ref 78.0–100.0)
Monocytes Absolute: 0.3 10*3/uL (ref 0.1–1.0)
Monocytes Relative: 5.4 % (ref 3.0–12.0)
Neutro Abs: 2.5 10*3/uL (ref 1.4–7.7)
Neutrophils Relative %: 50.1 % (ref 43.0–77.0)
Platelets: 243 10*3/uL (ref 150.0–400.0)
RBC: 4.18 Mil/uL (ref 3.87–5.11)
RDW: 15.6 % — ABNORMAL HIGH (ref 11.5–15.5)
WBC: 5 10*3/uL (ref 4.0–10.5)

## 2020-07-04 LAB — COMPREHENSIVE METABOLIC PANEL
ALT: 13 U/L (ref 0–35)
AST: 20 U/L (ref 0–37)
Albumin: 4.5 g/dL (ref 3.5–5.2)
Alkaline Phosphatase: 54 U/L (ref 39–117)
BUN: 14 mg/dL (ref 6–23)
CO2: 30 mEq/L (ref 19–32)
Calcium: 10 mg/dL (ref 8.4–10.5)
Chloride: 102 mEq/L (ref 96–112)
Creatinine, Ser: 0.8 mg/dL (ref 0.40–1.20)
GFR: 80.1 mL/min (ref >60.00)
Glucose, Bld: 83 mg/dL (ref 70–99)
Potassium: 4.4 mEq/L (ref 3.5–5.1)
Sodium: 139 mEq/L (ref 135–145)
Total Bilirubin: 0.7 mg/dL (ref 0.2–1.2)
Total Protein: 6.8 g/dL (ref 6.0–8.3)

## 2020-07-04 LAB — LIPID PANEL
Cholesterol: 215 mg/dL — ABNORMAL HIGH (ref 0–200)
HDL: 69.9 mg/dL (ref >39.00)
LDL Cholesterol: 133 mg/dL — ABNORMAL HIGH (ref 0–99)
NonHDL: 145.05
Total CHOL/HDL Ratio: 3
Triglycerides: 61 mg/dL (ref 0.0–149.0)
VLDL: 12.2 mg/dL (ref 0.0–40.0)

## 2020-07-04 MED ORDER — CALCIUM CARBONATE-VITAMIN D 600-400 MG-UNIT PO TABS: 1.0000 | ORAL_TABLET | Freq: Two times a day (BID) | ORAL | Status: AC

## 2020-07-04 NOTE — Addendum Note (Signed)
Addended by: Jiles Prows on: 07/04/2020 08:36 AM   Modules accepted: Orders

## 2020-07-04 NOTE — Progress Notes (Signed)
Subjective:    Patient ID: Joanne Jones, female    DOB: [birth year], 60 y.o.   MRN: [id number]  HPI  Patient presents today for complete physical.  Immunizations: pfizer x3, shingrix x 2, tetanus today Diet: healthy Exercise: regular- walks nightlypure barr Colonoscopy: due Dexa: 2018- normal Pap Smear: 07/04/2019 Mammogram: due Vision: up to date Dental: scheduled    Review of Systems  Constitutional: Negative for unexpected weight change.  HENT: Negative for hearing loss and rhinorrhea.   Eyes: Negative for visual disturbance.  Respiratory: Negative for cough and shortness of breath.   Cardiovascular: Negative for chest pain.  Gastrointestinal: Negative for constipation and diarrhea.  Genitourinary: Negative for dysuria, frequency and hematuria.  Musculoskeletal: Negative for arthralgias.  Skin: Negative for rash.  Neurological: Negative for headaches.  Hematological: Negative for adenopathy.  Psychiatric/Behavioral:       Denies depression/anxiety   Past Medical History:  Diagnosis Date  . Abnormal Pap smear and cervical HPV (human papillomavirus)    cervical cyst  . Astigmatism    S/P bialteral corneal transplants     Social History   Socioeconomic History  . Marital status: Married    Spouse name: Not on file  . Number of children: 2  . Years of education: 56  . Highest education level: Not on file  Occupational History  . Not on file  Tobacco Use  . Smoking status: Never Smoker  . Smokeless tobacco: Never Used  Vaping Use  . Vaping Use: Never used  Substance and Sexual Activity  . Alcohol use: Yes    Alcohol/week: 14.0 standard drinks    Types: 14 Glasses of wine per week    Comment: GLASS OF WINE   . Drug use: No  . Sexual activity: Yes    Partners: Male    Birth control/protection: Implant  Other Topics Concern  . 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    Social Determinants of Health   Financial Resource Strain: Not on file  Food Insecurity: Not on file  Transportation Needs: Not on file  Physical Activity: Not on file  Stress: Not on file  Social Connections: Not on file  Intimate Partner Violence: Not on file    Past Surgical History:  Procedure Laterality Date  . BREAST ENHANCEMENT SURGERY  94  . corneal transp;lant  84   left eye  . CORNEAL TRANSPLANT  88   right eye  . removal of vulvar lipoma  1994  . TOTAL HIP ARTHROPLASTY Right 12/28/2019   Procedure: RIGHT TOTAL HIP ARTHROPLASTY ANTERIOR APPROACH;  Surgeon: Mcarthur Rossetti, MD;  Location: [facility];  Service: Orthopedics;  Laterality: Right;    Family History  Problem Relation Age of Onset  . Cancer Maternal Grandmother        uterine  . Supraventricular tachycardia Mother        living  . Heart disease Father 26       hx of MV disease, LAD lesion, died of stroke complication of surgery  . Keratoconus Brother   . Hypertension Brother   . Anxiety disorder Brother     No Known Allergies  No current outpatient medications on file prior to visit.   No current facility-administered medications on file prior to visit.  BP 114/80 (BP Location: Right Arm, Patient Position: Sitting, Cuff Size: Small)   Pulse 72   Temp 98.4 F (36.9 C) (Oral)   Resp 16   Ht 5\' 4"  (1.626 m)   Wt 136 lb (61.7 kg)   LMP 03/20/2015   SpO2 100%   BMI 23.34 kg/m       Objective:   Physical Exam  Physical Exam  Constitutional: She is oriented to person, place, and time. She appears well-developed and well-nourished. No distress.  HENT:  Head: Normocephalic and atraumatic.  Right Ear: Tympanic membrane and ear canal normal.  Left Ear: Tympanic membrane and ear canal normal.  Mouth/Throat: not examined- pt wearing mask Eyes: Pupils are equal, round, and reactive to light. No  scleral icterus.  Neck: Normal range of motion. No thyromegaly present.  Cardiovascular: Normal rate and regular rhythm.   No murmur heard. Pulmonary/Chest: Effort normal and breath sounds normal. No respiratory distress. He has no wheezes. She has no rales. She exhibits no tenderness.  Abdominal: Soft. Bowel sounds are normal. She exhibits no distension and no mass. There is no tenderness. There is no rebound and no guarding.  Musculoskeletal: She exhibits no edema.  Lymphadenopathy:    She has no cervical adenopathy.  Neurological: She is alert and oriented to person, place, and time. She has normal patellar reflexes. She exhibits normal muscle tone. Coordination normal.  Skin: Skin is warm and dry.  Psychiatric: She has a normal mood and affect. Her behavior is normal. Judgment and thought content normal.  Breasts: Examined lying (bilateral breast implants) Right: Without masses, retractions, discharge or axillary adenopathy.  Left: Without masses, retractions, discharge or axillary adenopathy.  Pelvic: deferred           Assessment & Plan:   Preventative care- encouraged pt to continue healthy diet, exercise.  Refer for mammogram, colo. Tdap today.  Labs as ordered. Recommended that she add caltrate 600mg  + D twice daily for bone health.    This visit occurred during the SARS-CoV-2 public health emergency.  Safety protocols were in place, including screening questions prior to the visit, additional usage of staff PPE, and extensive cleaning of exam room while observing appropriate contact time as indicated for disinfecting solutions.         Assessment & Plan:

## 2020-07-24 DIAGNOSIS — K5730 Diverticulosis of large intestine without perforation or abscess without bleeding: Secondary | ICD-10-CM | POA: Diagnosis not present

## 2020-07-24 DIAGNOSIS — Z1211 Encounter for screening for malignant neoplasm of colon: Secondary | ICD-10-CM | POA: Diagnosis not present

## 2020-07-24 DIAGNOSIS — Z86010 Personal history of colonic polyps: Secondary | ICD-10-CM | POA: Diagnosis not present

## 2020-08-07 ENCOUNTER — Ambulatory Visit: Payer: 59 | Admitting: Orthopaedic Surgery

## 2020-08-11 ENCOUNTER — Ambulatory Visit (INDEPENDENT_AMBULATORY_CARE_PROVIDER_SITE_OTHER): Payer: 59 | Admitting: Orthopaedic Surgery

## 2020-08-11 ENCOUNTER — Encounter: Payer: Self-pay | Admitting: Orthopaedic Surgery

## 2020-08-11 ENCOUNTER — Ambulatory Visit (INDEPENDENT_AMBULATORY_CARE_PROVIDER_SITE_OTHER): Payer: 59

## 2020-08-11 DIAGNOSIS — Z96641 Presence of right artificial hip joint: Secondary | ICD-10-CM | POA: Diagnosis not present

## 2020-08-11 NOTE — Progress Notes (Signed)
Office Visit Note   Patient: Joanne Jones           Date of Birth: 07-Aug-1960           MRN: [id number] Visit Date: 08/11/2020              Requested by: Debbrah Alar, NP [address] [place],  [place] [id number] PCP: Debbrah Alar, NP   Assessment & Plan: Visit Diagnoses:  1. Status post total replacement of right hip     Plan: She will follow-up with [place] as needed.  She is activities as tolerated.  She can continue to work on scar tissue mobilization.  Questions were encouraged and answered.  Follow-Up Instructions: Return if symptoms worsen or fail to improve.   Orders:  Orders Placed This Encounter  Procedures  . XR HIP UNILAT W OR W/O PELVIS 1V RIGHT   No orders of the defined types were placed in this encounter.     Procedures: No procedures performed   Clinical Data: No additional findings.   Subjective: Chief Complaint  Patient presents with  . Right Hip - Follow-up    HPI  Review of Systems   Objective: Vital Signs: LMP 03/20/2015   Physical Exam Constitutional:      Appearance: She is not ill-appearing or diaphoretic.  Pulmonary:     Effort: Pulmonary effort is normal.  Neurological:     Mental Status: She is alert and oriented to person, place, and time.  Psychiatric:        Mood and Affect: Mood normal.     Ortho Exam Right hip good range of motion without pain.  Calf supple nontender.  Ambulates without any assistive device.  Specialty Comments:  No specialty comments available.  Imaging: XR HIP UNILAT W OR W/O PELVIS 1V RIGHT  Result Date: 08/11/2020 AP pelvis lateral view of the right hip: Both hips well located.  Status post right total hip arthroplasty well-seated components.  No acute fractures no bony abnormalities.  Left hip is well-maintained.    PMFS History: Patient Active Problem List   Diagnosis Date Noted  . Status post total replacement of right hip 01/10/2020  . Primary  osteoarthritis of right hip 10/30/2019  . Right hip pain 09/24/2017  . Encounter for routine gynecological examination 03/22/2016  . Preventative health care 05/23/2015  . Colon polyp 07/07/2013  . Diverticulosis 07/07/2013  . ASCUS favor benign 03/22/2013  . Post corneal transplant 03/21/2013  . Glaucoma suspect of both eyes 02/28/2013  . 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 Endometrial polyp 03/03/2011  . Astigmatism    Past Medical History:  Diagnosis Date  . Abnormal Pap smear and cervical HPV (human papillomavirus)    cervical cyst  . Astigmatism    S/P bialteral corneal transplants    Family History  Problem Relation Age of Onset  . Cancer Maternal Grandmother        uterine  . Supraventricular tachycardia Mother        living  . Heart disease Father 23       hx of MV disease, LAD lesion, died of stroke complication of surgery  . Keratoconus Brother   . Hypertension Brother   . Anxiety disorder Brother     Past Surgical History:  Procedure Laterality Date  . BREAST ENHANCEMENT SURGERY  94  . corneal transp;lant  84   left eye  . CORNEAL TRANSPLANT  88   right eye  .  removal of vulvar lipoma  1994  . TOTAL HIP ARTHROPLASTY Right 12/28/2019   Procedure: RIGHT TOTAL HIP ARTHROPLASTY ANTERIOR APPROACH;  Surgeon: Mcarthur Rossetti, MD;  Location: [facility];  Service: Orthopedics;  Laterality: Right;   Social History   Occupational History  . Not on file  Tobacco Use  . Smoking status: Never Smoker  . Smokeless tobacco: Never Used  Vaping Use  . Vaping Use: Never used  Substance and Sexual Activity  . Alcohol use: Yes    Alcohol/week: 14.0 standard drinks    Types: 14 Glasses of wine per week    Comment: GLASS OF WINE   . Drug use: No  . Sexual activity: Yes    Partners: Male    Birth control/protection: Implant

## 2020-08-18 ENCOUNTER — Other Ambulatory Visit (HOSPITAL_COMMUNITY): Payer: Self-pay

## 2020-08-25 ENCOUNTER — Other Ambulatory Visit: Payer: Self-pay | Admitting: Family

## 2020-08-25 DIAGNOSIS — Z1231 Encounter for screening mammogram for malignant neoplasm of breast: Secondary | ICD-10-CM

## 2020-09-10 ENCOUNTER — Other Ambulatory Visit (HOSPITAL_COMMUNITY): Payer: Self-pay

## 2020-09-10 MED ORDER — CLENPIQ 10-3.5-12 MG-GM -GM/160ML PO SOLN
ORAL | 0 refills | Status: DC
Start: 2020-09-10 — End: 2021-11-03
  Filled 2020-09-10: qty 320, 1d supply, fill #0

## 2020-09-11 ENCOUNTER — Other Ambulatory Visit (HOSPITAL_COMMUNITY): Payer: Self-pay

## 2020-09-12 ENCOUNTER — Other Ambulatory Visit (HOSPITAL_COMMUNITY): Payer: Self-pay

## 2020-09-22 DIAGNOSIS — Z1211 Encounter for screening for malignant neoplasm of colon: Secondary | ICD-10-CM | POA: Diagnosis not present

## 2020-09-22 LAB — HM COLONOSCOPY

## 2020-10-23 ENCOUNTER — Ambulatory Visit: Payer: 59

## 2020-12-11 ENCOUNTER — Other Ambulatory Visit: Payer: Self-pay

## 2020-12-11 ENCOUNTER — Ambulatory Visit
Admission: RE | Admit: 2020-12-11 | Discharge: 2020-12-11 | Disposition: A | Discharge status: Home / Self Care | Payer: 59 | Source: Ambulatory Visit | Attending: Family | Admitting: Family

## 2020-12-11 DIAGNOSIS — Z1231 Encounter for screening mammogram for malignant neoplasm of breast: Secondary | ICD-10-CM

## 2021-02-24 DIAGNOSIS — H524 Presbyopia: Secondary | ICD-10-CM | POA: Diagnosis not present

## 2021-02-24 DIAGNOSIS — Z947 Corneal transplant status: Secondary | ICD-10-CM | POA: Diagnosis not present

## 2021-02-24 DIAGNOSIS — H52223 Regular astigmatism, bilateral: Secondary | ICD-10-CM | POA: Diagnosis not present

## 2021-02-24 DIAGNOSIS — H25033 Anterior subcapsular polar age-related cataract, bilateral: Secondary | ICD-10-CM | POA: Diagnosis not present

## 2021-02-24 DIAGNOSIS — H5211 Myopia, right eye: Secondary | ICD-10-CM | POA: Diagnosis not present

## 2021-02-24 DIAGNOSIS — H2513 Age-related nuclear cataract, bilateral: Secondary | ICD-10-CM | POA: Diagnosis not present

## 2021-02-24 DIAGNOSIS — H25043 Posterior subcapsular polar age-related cataract, bilateral: Secondary | ICD-10-CM | POA: Diagnosis not present

## 2021-02-24 DIAGNOSIS — H5202 Hypermetropia, left eye: Secondary | ICD-10-CM | POA: Diagnosis not present

## 2021-06-05 DIAGNOSIS — Z947 Corneal transplant status: Secondary | ICD-10-CM | POA: Diagnosis not present

## 2021-06-05 DIAGNOSIS — H40013 Open angle with borderline findings, low risk, bilateral: Secondary | ICD-10-CM | POA: Diagnosis not present

## 2021-09-15 DIAGNOSIS — Z947 Corneal transplant status: Secondary | ICD-10-CM | POA: Diagnosis not present

## 2021-09-15 DIAGNOSIS — H2513 Age-related nuclear cataract, bilateral: Secondary | ICD-10-CM | POA: Diagnosis not present

## 2021-09-15 DIAGNOSIS — H40013 Open angle with borderline findings, low risk, bilateral: Secondary | ICD-10-CM | POA: Diagnosis not present

## 2021-11-03 ENCOUNTER — Encounter: Payer: Self-pay | Admitting: Family

## 2021-11-03 ENCOUNTER — Ambulatory Visit (INDEPENDENT_AMBULATORY_CARE_PROVIDER_SITE_OTHER): Payer: 59 | Admitting: Family

## 2021-11-03 VITALS — BP 126/77 | HR 65 | Temp 98.2°F | Resp 16 | Ht 64.7 in | Wt 137.0 lb

## 2021-11-03 DIAGNOSIS — Z0000 Encounter for general adult medical examination without abnormal findings: Secondary | ICD-10-CM

## 2021-11-03 DIAGNOSIS — E785 Hyperlipidemia, unspecified: Secondary | ICD-10-CM | POA: Diagnosis not present

## 2021-11-03 LAB — COMPREHENSIVE METABOLIC PANEL
ALT: 13 U/L (ref 0–35)
AST: 22 U/L (ref 0–37)
Albumin: 4.6 g/dL (ref 3.5–5.2)
Alkaline Phosphatase: 55 U/L (ref 39–117)
BUN: 13 mg/dL (ref 6–23)
CO2: 30 mEq/L (ref 19–32)
Calcium: 9.8 mg/dL (ref 8.4–10.5)
Chloride: 102 mEq/L (ref 96–112)
Creatinine, Ser: 0.81 mg/dL (ref 0.40–1.20)
GFR: 78.18 mL/min (ref >60.00)
Glucose, Bld: 92 mg/dL (ref 70–99)
Potassium: 4 mEq/L (ref 3.5–5.1)
Sodium: 139 mEq/L (ref 135–145)
Total Bilirubin: 0.9 mg/dL (ref 0.2–1.2)
Total Protein: 6.8 g/dL (ref 6.0–8.3)

## 2021-11-03 LAB — LIPID PANEL
Cholesterol: 248 mg/dL — ABNORMAL HIGH (ref 0–200)
HDL: 72.6 mg/dL (ref >39.00)
LDL Cholesterol: 161 mg/dL — ABNORMAL HIGH (ref 0–99)
NonHDL: 175.64
Total CHOL/HDL Ratio: 3
Triglycerides: 74 mg/dL (ref 0.0–149.0)
VLDL: 14.8 mg/dL (ref 0.0–40.0)

## 2021-11-03 NOTE — Assessment & Plan Note (Addendum)
Continue healthy diet, exercise. Encouraged her to get the bivalent covid booster/flu shot this fall. Colo/pap up to date.

## 2021-11-03 NOTE — Progress Notes (Signed)
Subjective:   By signing my name below, I, [name], attest that this documentation has been prepared under the direction and in the presence of Debbrah Alar, NP 11/03/2021.   Patient ID: Joanne Jones, female    DOB: 06-24-1960, 61 y.o.   MRN: [id number]  Chief Complaint  Patient presents with   Annual Exam    HPI Patient is in today for a comprehensive physical exam. She denies having any fever, new moles, congestion, sinus pain, sore throat, chest pain, palpitations, cough, shortness of breath, wheezing, nausea, vomiting, diarrhea, constipation, dysuria, frequency, abdominal pain, hematuria, new muscle pain, new joint pain, headaches.  New mole- She has a new mole on her back she is inquiring about.  Family history- She reports no changes to her family history. Social history: She drinks about 2 glasses of wine a day. She does not consume tobacco or vape products nor drugs. Immunizations: She is UTD on her tetanus immunizations and has completed her shingles immunizations. She receives the flu vaccination annually. She has not yet received the Bivalent COVID-19 booster. Diet: She reports that her diet is relatively well managed.  Exercise: She walks daily.  Colonoscopy: Last completed on 09/22/2020.  Dexa: Last completed on 06/16/2016. Results were normal.  Pap Smear: Last completed on 07/04/2019. Results were normal Due. Mammogram: Last completed on 12/13/2020. No mammographic evidence of malignancy. Repeat in 1 year. She receives mammograms at the [facility].   Cholesterol- Her cholesterol was mildly elevated during her last visit.  Lab Results  Component Value Date   CHOL 215 (H) 07/04/2020   HDL 69.90 07/04/2020   LDLCALC 133 (H) 07/04/2020   TRIG 61.0 07/04/2020   CHOLHDL 3 07/04/2020    Past Medical History:  Diagnosis Date   Abnormal Pap smear and cervical HPV (human papillomavirus)    cervical cyst   Astigmatism    S/P bialteral  corneal transplants    Past Surgical History:  Procedure Laterality Date   BREAST ENHANCEMENT SURGERY  94   corneal transp;lant  84   left eye   CORNEAL TRANSPLANT  88   right eye   removal of vulvar lipoma  1994   TOTAL HIP ARTHROPLASTY Right 12/28/2019   Procedure: RIGHT TOTAL HIP ARTHROPLASTY ANTERIOR APPROACH;  Surgeon: Mcarthur Rossetti, MD;  Location: [facility];  Service: Orthopedics;  Laterality: Right;    Family History  Problem Relation Age of Onset   Cancer Maternal Grandmother        uterine   Supraventricular tachycardia Mother        living   Heart disease Father 66       hx of MV disease, LAD lesion, died of stroke complication of surgery   Keratoconus Brother    Hypertension Brother    Anxiety disorder Brother     Social History   Socioeconomic History   Marital status: Married    Spouse name: Not on file   Number of children: 2   Years of education: 18   Highest education level: Not on file  Occupational History   Not on file  Tobacco Use   Smoking status: Never   Smokeless tobacco: Never  Vaping Use   Vaping Use: Never used  Substance and Sexual Activity   Alcohol use: Yes    Alcohol/week: 14.0 standard drinks of alcohol    Types: 14 Glasses of wine per week    Comment: GLASS OF WINE    Drug use: No  Sexual activity: Yes    Partners: Male  Other Topics Concern   Not on file  Social History Narrative   Works for [employer] Civil engineer, 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    Social Determinants of Health   Financial Resource Strain: Not on file  Food Insecurity: Not on file  Transportation Needs: Not on file  Physical Activity: Not on file  Stress: Not on file  Social Connections: Not on file  Intimate Partner Violence: Not on file    Outpatient Medications Prior to Visit  Medication Sig Dispense Refill   Calcium  Carbonate-Vitamin D 600-400 MG-UNIT tablet Take 1 tablet by mouth 2 (two) times daily.     Sod Picosulfate-Mag Ox-Cit Acd (CLENPIQ) 10-3.5-12 MG-GM -GM/160ML SOLN TAKE 160 MLS BY MOUTH AS DIRECTED PER OFFICE INSTRUCTIONS (do not take per package instructions) 320 mL 0   No facility-administered medications prior to visit.    No Known Allergies  Review of Systems  Constitutional:  Negative for chills and fever.  HENT:  Negative for congestion, sinus pain and sore throat.   Respiratory:  Negative for cough, sputum production, shortness of breath and wheezing.   Cardiovascular:  Negative for leg swelling.  Gastrointestinal:  Negative for abdominal pain, constipation, diarrhea, nausea and vomiting.  Genitourinary:  Negative for dysuria, frequency, hematuria and urgency.  Musculoskeletal:  Negative for myalgias.  Skin:  Negative for itching and rash.       (+) new mole  Neurological:  Negative for headaches.  Psychiatric/Behavioral:  Negative for depression.        Objective:    Physical Exam Constitutional:      General: She is not in acute distress.    Appearance: Normal appearance. She is not ill-appearing.  HENT:     Head: Normocephalic and atraumatic.     Right Ear: Tympanic membrane, ear canal and external ear normal.     Left Ear: Tympanic membrane, ear canal and external ear normal.  Eyes:     Extraocular Movements: Extraocular movements intact.     Right eye: No nystagmus.     Left eye: No nystagmus.     Pupils: Pupils are equal, round, and reactive to light.  Cardiovascular:     Rate and Rhythm: Normal rate and regular rhythm.     Pulses: Normal pulses.     Heart sounds: Normal heart sounds. No murmur heard.    No gallop.  Chest:  Breasts:    Right: Normal.     Left: Normal.     Comments: bilateral Abdominal:     General: Bowel sounds are normal.     Palpations: Abdomen is soft.     Tenderness: There is no abdominal tenderness. There is no guarding.   Musculoskeletal:     Comments: Muscle strength 5/5 on upper and lower extremities  Skin:    General: Skin is warm and dry.  Neurological:     Mental Status: She is alert and oriented to person, place, and time.     Deep Tendon Reflexes:     Reflex Scores:      Patellar reflexes are 2+ on the right side and 2+ on the left side. Psychiatric:        Mood and Affect: Mood normal.        Behavior: Behavior normal.  Judgment: Judgment normal.     BP 126/77 (BP Location: Right Arm, Patient Position: Sitting, Cuff Size: Small)   Pulse 65   Temp 98.2 F (36.8 C) (Oral)   Resp 16   Ht 5' 4.7" (1.643 m)   Wt 137 lb (62.1 kg)   LMP 03/20/2015   SpO2 100%   BMI 23.01 kg/m  Wt Readings from Last 3 Encounters:  11/03/21 137 lb (62.1 kg)  07/04/20 136 lb (61.7 kg)  12/28/19 134 lb 5 oz (60.9 kg)         Assessment & Plan:   Problem List Items Addressed This Visit       Unprioritized   Preventative health care    Continue healthy diet, exercise. Encouraged her to get the bivalent covid booster/flu shot this fall. Colo/pap up to date.       Other Visit Diagnoses     Encounter for preventive care    -  Primary   Relevant Orders   MM 3D SCREEN BREAST BILATERAL   Hyperlipidemia, unspecified hyperlipidemia type       Relevant Orders   Lipid panel   Comp Met (CMET)       No orders of the defined types were placed in this encounter.   I, Nance Pear, NP, personally preformed the services described in this documentation.  All medical record entries made by the scribe were at my direction and in my presence.  I have reviewed the chart and discharge instructions (if applicable) and agree that the record reflects my personal performance and is accurate and complete. 11/03/2021.  I,Mohammed Iqbal,acting as a Education administrator for [employer], NP.,have documented all relevant documentation on the behalf of Nance Pear, NP,as directed by  Nance Pear, NP while in the presence of Nance Pear, NP.  Nance Pear, NP

## 2021-12-15 ENCOUNTER — Ambulatory Visit
Admission: RE | Admit: 2021-12-15 | Discharge: 2021-12-15 | Disposition: A | Discharge status: Home / Self Care | Payer: 59 | Source: Ambulatory Visit | Attending: Family | Admitting: Family

## 2021-12-15 DIAGNOSIS — Z1231 Encounter for screening mammogram for malignant neoplasm of breast: Secondary | ICD-10-CM | POA: Diagnosis not present

## 2021-12-15 DIAGNOSIS — Z0000 Encounter for general adult medical examination without abnormal findings: Secondary | ICD-10-CM

## 2022-02-15 IMAGING — MR MR HIP*R* W/O CM
5 of 6 series · 33 of 40 positions shown · non-contrast
Comparison: Plain films right hip from [REDACTED] [HOSPITAL]
07/25/2019.

CLINICAL DATA: Chronic right hip pain.  No known injury.

EXAM:
MR OF THE RIGHT HIP WITHOUT CONTRAST
TECHNIQUE: Multiplanar, multisequence MR imaging was performed. No intravenous
contrast was administered.

[Series 4: T2 fat-sat · coronal · 4.0mm · 0.74mm/px · 7 of 24 slices shown (1 of 2)]
[im 1/24]
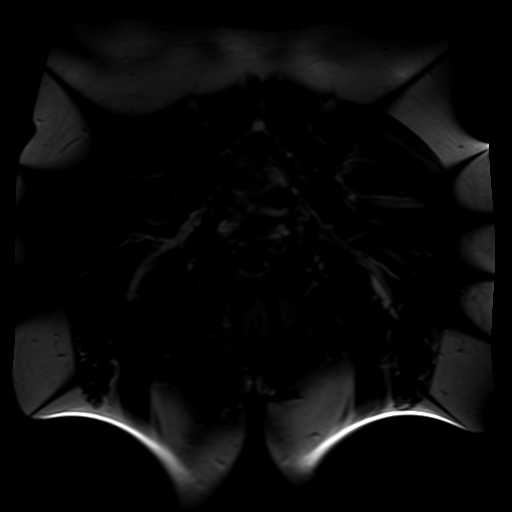
[im 4/24]
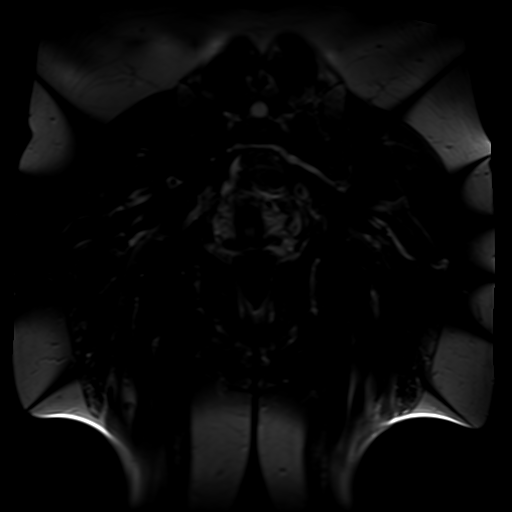
[im 8/24]
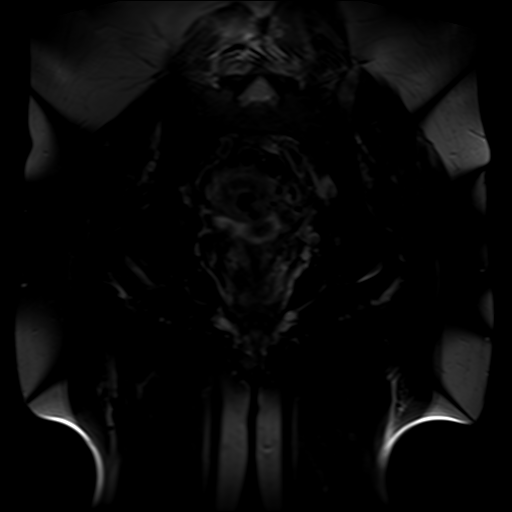
[im 12/24]
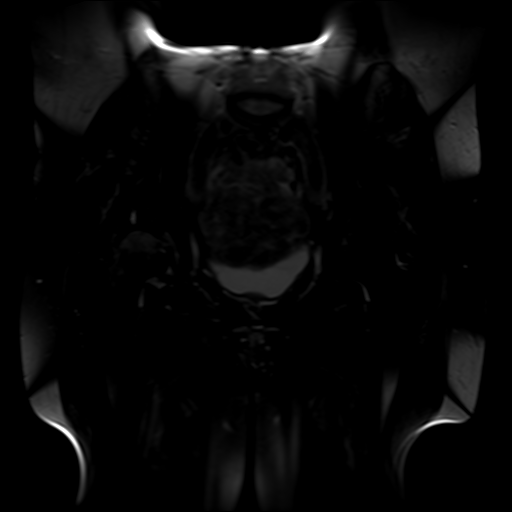
[im 16/24]
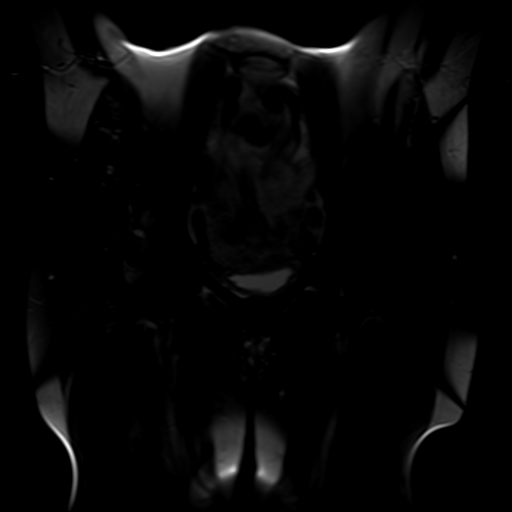
[im 20/24]
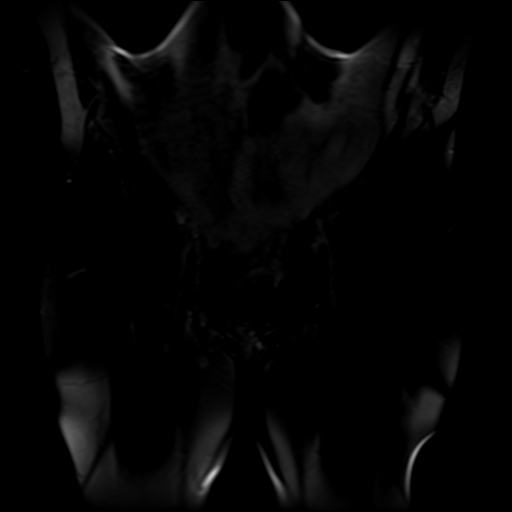
[im 24/24]
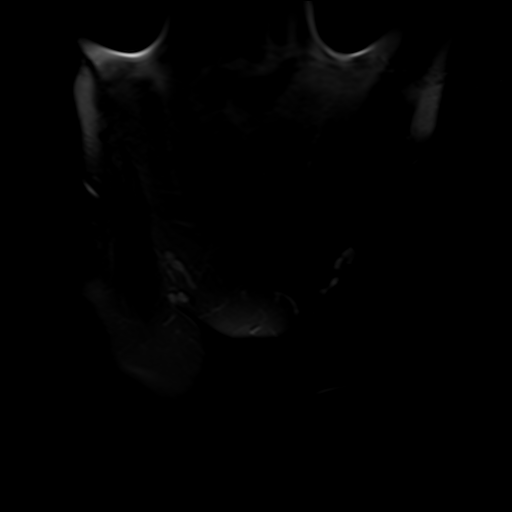

[Series 5: T2 fat-sat · axial · 4.0mm · 0.35mm/px · z∈[-104,+1]mm · 7 of 26 slices shown (2 of 2)]
[im 1/26]
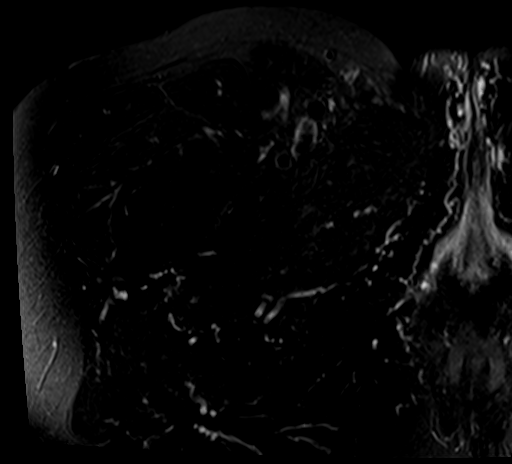
[im 4/26]
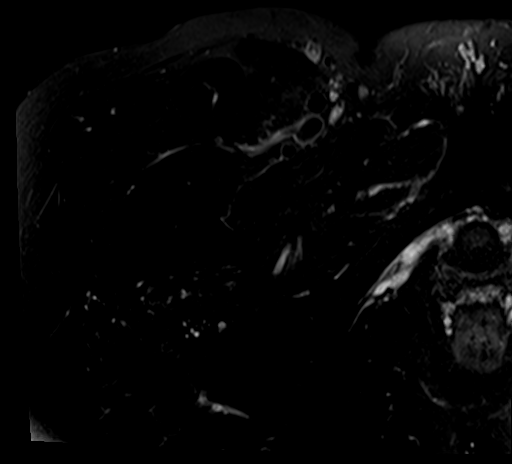
[im 8/26]
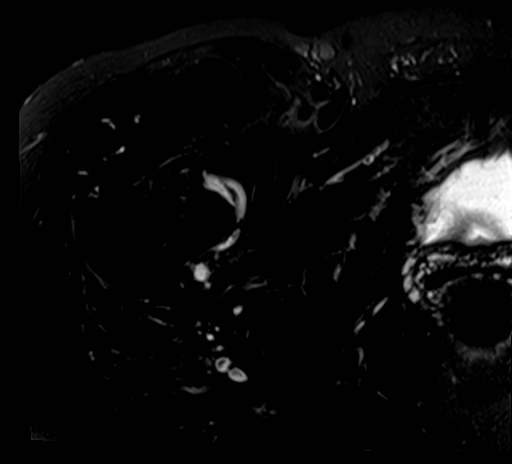
[im 11/26]
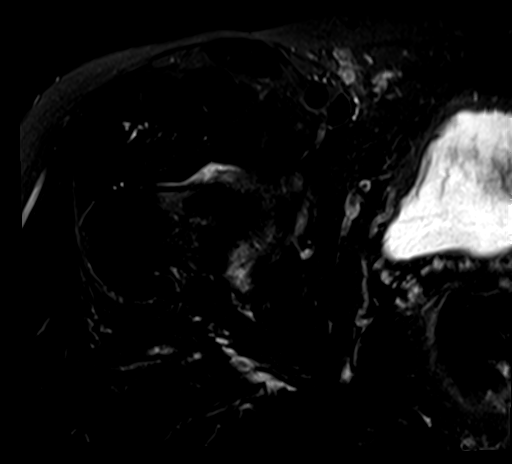
[im 15/26]
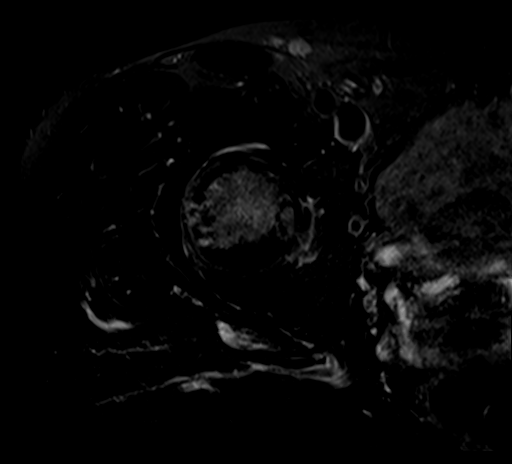
[im 18/26]
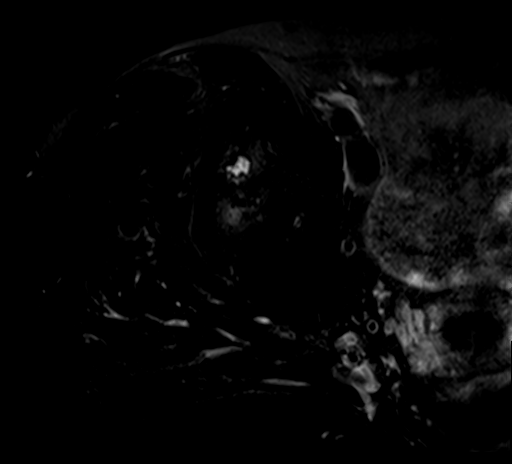
[im 22/26]
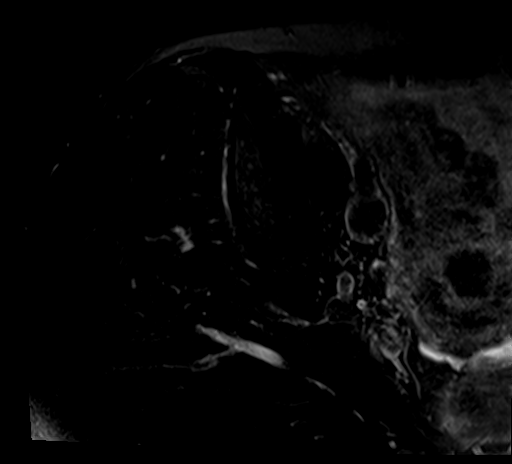

[Series 6: PD fat-sat · sagittal · 4.0mm · 0.70mm/px · 7 of 24 slices shown (1 of 3)]
[im 1/24]
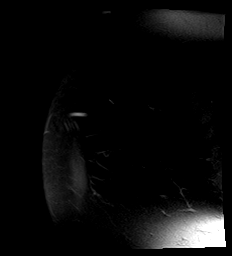
[im 4/24]
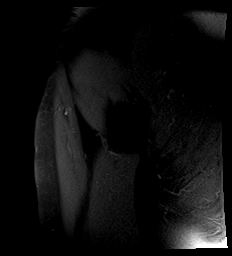
[im 8/24]
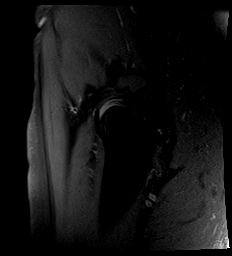
[im 12/24]
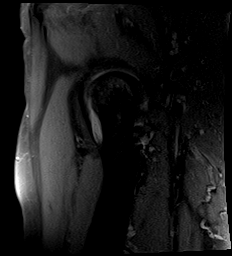
[im 16/24]
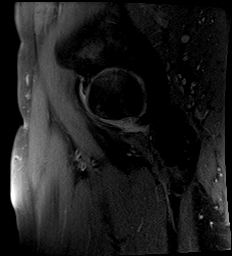
[im 20/24]
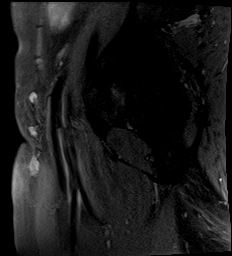
[im 24/24]
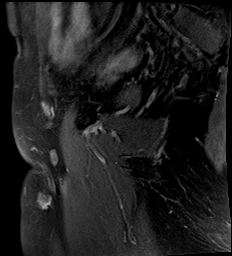

[Series 7: PD fat-sat · coronal · 4.0mm · 0.70mm/px · 6 of 19 slices shown (2 of 3)]
[im 1/19]
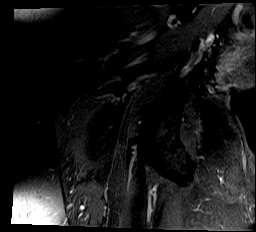
[im 4/19]
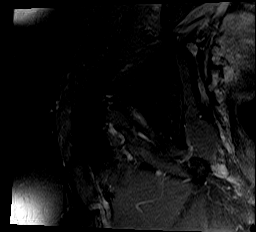
[im 8/19]
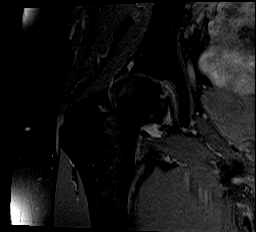
[im 11/19]
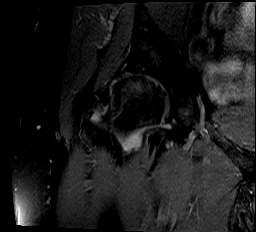
[im 15/19]
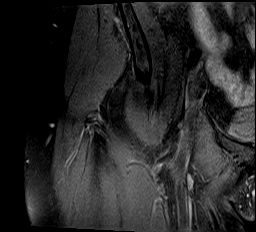
[im 19/19]
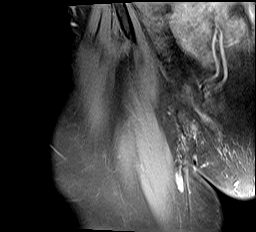

[Series 8: PD fat-sat · oblique · 4.0mm · 0.70mm/px · 6 of 20 slices shown (3 of 3)]
[im 1/20]
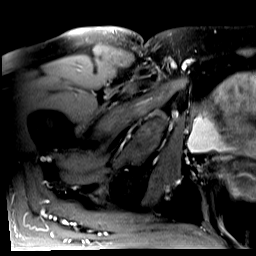
[im 4/20]
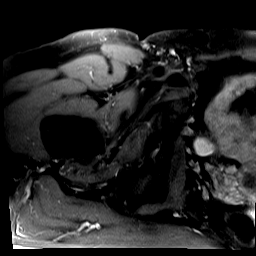
[im 8/20]
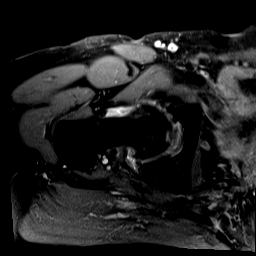
[im 12/20]
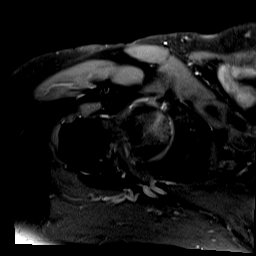
[im 16/20]
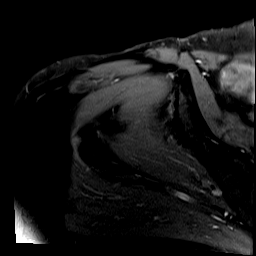
[im 20/20]
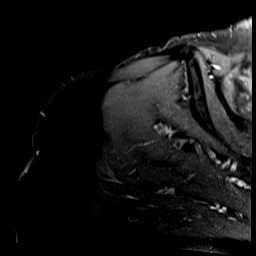

[33 of 40 positions shown; findings below may reference images not displayed]

FINDINGS: Bones: Marrow edema is seen about the right hip which extends into
the superior margin of the femoral neck. Subchondral cyst formation
is identified in the acetabulum and there is osteophytosis about the
right hip. Bone marrow signal is otherwise normal without fracture,
stress change or worrisome lesion. No avascular necrosis of the
femoral heads.

Articular cartilage and labrum

Articular cartilage:  Markedly thinned throughout.

Labrum: Degenerative tearing of the anterior and superior right
labrum is seen.

Joint or bursal effusion

Joint effusion:  Small right hip joint effusion.

Bursae: No evidence of bursitis.

Muscles and tendons

Muscles and tendons:  Intact and unremarkable.

Other findings

Miscellaneous:   Small uterine fibroids noted.
IMPRESSION: Markedly advanced for age right hip osteoarthritis with associated
degeneration and tearing of the right anterior and superior labrum.

## 2022-03-25 DIAGNOSIS — H40013 Open angle with borderline findings, low risk, bilateral: Secondary | ICD-10-CM | POA: Diagnosis not present

## 2022-03-25 DIAGNOSIS — H25033 Anterior subcapsular polar age-related cataract, bilateral: Secondary | ICD-10-CM | POA: Diagnosis not present

## 2022-03-25 DIAGNOSIS — H524 Presbyopia: Secondary | ICD-10-CM | POA: Diagnosis not present

## 2022-03-25 DIAGNOSIS — Z947 Corneal transplant status: Secondary | ICD-10-CM | POA: Diagnosis not present

## 2022-03-25 DIAGNOSIS — H25043 Posterior subcapsular polar age-related cataract, bilateral: Secondary | ICD-10-CM | POA: Diagnosis not present

## 2022-08-16 DIAGNOSIS — Z947 Corneal transplant status: Secondary | ICD-10-CM | POA: Diagnosis not present

## 2022-11-08 ENCOUNTER — Encounter: Payer: 59 | Admitting: Family

## 2022-11-16 ENCOUNTER — Other Ambulatory Visit (HOSPITAL_COMMUNITY)
Admission: RE | Admit: 2022-11-16 | Discharge: 2022-11-16 | Disposition: A | Discharge status: Home / Self Care | Payer: 59 | Source: Ambulatory Visit | Attending: Family | Admitting: Family

## 2022-11-16 ENCOUNTER — Telehealth: Payer: Self-pay | Admitting: Family

## 2022-11-16 ENCOUNTER — Ambulatory Visit (INDEPENDENT_AMBULATORY_CARE_PROVIDER_SITE_OTHER): Payer: 59 | Admitting: Family

## 2022-11-16 ENCOUNTER — Telehealth (HOSPITAL_BASED_OUTPATIENT_CLINIC_OR_DEPARTMENT_OTHER): Payer: Self-pay

## 2022-11-16 VITALS — BP 128/83 | HR 80 | Temp 98.1°F | Resp 16 | Ht 64.3 in | Wt 135.0 lb

## 2022-11-16 DIAGNOSIS — Z1231 Encounter for screening mammogram for malignant neoplasm of breast: Secondary | ICD-10-CM

## 2022-11-16 DIAGNOSIS — Z0000 Encounter for general adult medical examination without abnormal findings: Secondary | ICD-10-CM

## 2022-11-16 DIAGNOSIS — M25551 Pain in right hip: Secondary | ICD-10-CM

## 2022-11-16 DIAGNOSIS — Z01419 Encounter for gynecological examination (general) (routine) without abnormal findings: Secondary | ICD-10-CM | POA: Insufficient documentation

## 2022-11-16 DIAGNOSIS — E785 Hyperlipidemia, unspecified: Secondary | ICD-10-CM

## 2022-11-16 LAB — COMPREHENSIVE METABOLIC PANEL
ALT: 12 U/L (ref 0–35)
AST: 21 U/L (ref 0–37)
Albumin: 4.9 g/dL (ref 3.5–5.2)
Alkaline Phosphatase: 60 U/L (ref 39–117)
BUN: 14 mg/dL (ref 6–23)
CO2: 30 mEq/L (ref 19–32)
Calcium: 10.2 mg/dL (ref 8.4–10.5)
Chloride: 101 mEq/L (ref 96–112)
Creatinine, Ser: 0.85 mg/dL (ref 0.40–1.20)
GFR: 73.25 mL/min (ref >60.00)
Glucose, Bld: 89 mg/dL (ref 70–99)
Potassium: 4.2 mEq/L (ref 3.5–5.1)
Sodium: 137 mEq/L (ref 135–145)
Total Bilirubin: 0.9 mg/dL (ref 0.2–1.2)
Total Protein: 7 g/dL (ref 6.0–8.3)

## 2022-11-16 LAB — LIPID PANEL
Cholesterol: 264 mg/dL — ABNORMAL HIGH (ref 0–200)
HDL: 80.4 mg/dL (ref >39.00)
LDL Cholesterol: 171 mg/dL — ABNORMAL HIGH (ref 0–99)
NonHDL: 183.4
Total CHOL/HDL Ratio: 3
Triglycerides: 61 mg/dL (ref 0.0–149.0)
VLDL: 12.2 mg/dL (ref 0.0–40.0)

## 2022-11-16 NOTE — Assessment & Plan Note (Signed)
Weight at goal. Continue healthy diet and exercise.  Request copy of colonoscopy report.

## 2022-11-16 NOTE — Telephone Encounter (Signed)
Please call Dr. Kenna Gilbert office and request copy of colo.

## 2022-11-16 NOTE — Progress Notes (Signed)
Subjective:     Patient ID: Joanne Jones, female    DOB: 1961-04-06, 62 y.o.   MRN: [id number]  Chief Complaint  Patient presents with   Annual Exam         HPI  Discussed the use of AI scribe software for clinical note transcription with the patient, who gave verbal consent to proceed.   Patient presents today for annual CPE.  She has no complaints.   Colo up to date- Dr. Loreta Ave completed, will request report from GI Pap up to date Immunizations reviewed and up to date- just recommend flu/covid boosters in te fall Vision/dental: up to date  Reports moderate exercise and healthy diet      Health Maintenance Due  Topic Date Due   COLON CANCER SCREENING ANNUAL FOBT  06/24/2017   COVID-19 Vaccine (4 - 2023-24 season) 12/18/2021   PAP SMEAR-Modifier  07/04/2022    Past Medical History:  Diagnosis Date   Abnormal Pap smear and cervical HPV (human papillomavirus)    cervical cyst   Astigmatism    S/P bialteral corneal transplants    Past Surgical History:  Procedure Laterality Date   AUGMENTATION MAMMAPLASTY Bilateral    BREAST ENHANCEMENT SURGERY  04/19/1992   corneal transp;lant  04/19/1982   left eye   CORNEAL TRANSPLANT  04/19/1986   right eye   removal of vulvar lipoma  04/19/1992   TOTAL HIP ARTHROPLASTY Right 12/28/2019   Procedure: RIGHT TOTAL HIP ARTHROPLASTY ANTERIOR APPROACH;  Surgeon: Kathryne Hitch, MD;  Location: [facility];  Service: Orthopedics;  Laterality: Right;    Family History  Problem Relation Age of Onset   Cancer Maternal Grandmother        uterine   Supraventricular tachycardia Mother        living   Heart disease Father 48       hx of MV disease, LAD lesion, died of stroke complication of surgery   Keratoconus Brother    Hypertension Brother    Anxiety disorder Brother     Social History   Socioeconomic History   Marital status: Married    Spouse name: Not on file   Number of children: 2   Years of education: 18    Highest education level: Not on file  Occupational History   Not on file  Tobacco Use   Smoking status: Never   Smokeless tobacco: Never  Vaping Use   Vaping status: Never Used  Substance and Sexual Activity   Alcohol use: Yes    Alcohol/week: 14.0 standard drinks of alcohol    Types: 14 Glasses of wine per week    Comment: GLASS OF WINE    Drug use: No   Sexual activity: Yes    Partners: Male  Other Topics Concern   Not on file  Social History Narrative   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    Social Determinants of Health   Financial Resource Strain: Not on file  Food Insecurity: Not on file  Transportation Needs: Not on file  Physical Activity: Not on file  Stress: Not on file  Social Connections: Not on file  Intimate Partner Violence: Not on file    Outpatient Medications Prior to Visit  Medication Sig Dispense Refill   Calcium Carbonate-Vitamin D 600-400 MG-UNIT tablet Take 1 tablet  by mouth 2 (two) times daily.     No facility-administered medications prior to visit.    No Known Allergies  Review of Systems  Constitutional:  Negative for chills, fever and weight loss.  HENT:  Negative for ear discharge, ear pain, hearing loss and tinnitus.   Eyes:  Negative for pain.  Respiratory:  Negative for cough and shortness of breath.   Cardiovascular:  Negative for chest pain and palpitations.  Gastrointestinal:  Negative for diarrhea, heartburn, nausea and vomiting.  Genitourinary:  Negative for dysuria and urgency.  Musculoskeletal:  Positive for joint pain (hip pain- being addressed by ortho).  Skin:  Negative for itching and rash.       Objective:    Physical Exam   BP 128/83 (BP Location: Right Arm, Patient Position: Sitting, Cuff Size: Small)   Pulse 80   Temp 98.1 F (36.7 C) (Oral)   Resp 16   Ht 5' 4.3"  (1.633 m)   Wt 135 lb (61.2 kg)   LMP 03/20/2015   SpO2 98%   BMI 22.96 kg/m  Wt Readings from Last 3 Encounters:  11/16/22 135 lb (61.2 kg)  11/03/21 137 lb (62.1 kg)  07/04/20 136 lb (61.7 kg)   Physical Exam  Constitutional: She is oriented to person, place, and time. She appears well-developed and well-nourished. No distress.  HENT:  Head: Normocephalic and atraumatic.  Right Ear: Tympanic membrane and ear canal normal.  Left Ear: Tympanic membrane and ear canal normal.  Mouth/Throat: Oropharynx is clear and moist.  Eyes: Pupils are equal, round, and reactive to light. No scleral icterus.  Neck: Normal range of motion. No thyromegaly present.  Cardiovascular: Normal rate and regular rhythm.   No murmur heard. Pulmonary/Chest: Effort normal and breath sounds normal. No respiratory distress. He has no wheezes. She has no rales. She exhibits no tenderness.  Abdominal: Soft. Bowel sounds are normal. She exhibits no distension and no mass. There is no tenderness. There is no rebound and no guarding.  Musculoskeletal: She exhibits no edema.  Lymphadenopathy:    She has no cervical adenopathy.  Neurological: She is alert and oriented to person, place, and time. She has normal patellar reflexes. She exhibits normal muscle tone. Coordination normal.  Skin: Skin is warm and dry.  Psychiatric: She has a normal mood and affect. Her behavior is normal. Judgment and thought content normal.  Breasts: Examined lying (bilateral breast implants) Right: Without masses, retractions, discharge or axillary adenopathy.  Left: Without masses, retractions, discharge or axillary adenopathy.  Inguinal/mons: Normal without inguinal adenopathy  External genitalia: Normal  BUS/Urethra/Skene's glands: Normal  Bladder: Normal  Vagina: Normal  Cervix: Normal  Uterus: normal in size, shape and contour. Midline and mobile  Adnexa/parametria:  Rt: Without masses or tenderness.  Lt: Without masses or  tenderness.  Anus and perineum: Normal            Assessment & Plan:        Assessment & Plan:   Problem List Items Addressed This Visit       Unprioritized   Right hip pain - Primary    Hip replacement in Sep 202, Dr Magnus Ivan did the surgery, pt states doing fine. But now having issues with her left       Preventative health care    Weight at goal. Continue healthy diet and exercise.  Request copy of colonoscopy report.       Other Visit Diagnoses     Breast cancer screening by  mammogram       Relevant Orders   MM 3D SCREENING MAMMOGRAM BILATERAL BREAST W/IMPLANT   Hyperlipidemia, unspecified hyperlipidemia type       Relevant Orders   Comp Met (CMET)   Lipid panel   Encounter for routine gynecological examination with Papanicolaou smear of cervix       Relevant Orders   Cytology - PAP( )       I am having Clydie Braun A. Creson maintain her Calcium Carbonate-Vitamin D.  No orders of the defined types were placed in this encounter.

## 2022-11-16 NOTE — Telephone Encounter (Signed)
Electronic request sent 

## 2022-11-16 NOTE — Assessment & Plan Note (Addendum)
Hip replacement in Sep 2021, Dr Magnus Ivan did the surgery, pt states doing fine. But now having issues with her left which she is addressing with ortho.

## 2022-11-16 NOTE — Assessment & Plan Note (Signed)
Followed by an eye doctor at [facility] for her eye problems

## 2023-01-07 ENCOUNTER — Other Ambulatory Visit: Payer: Self-pay | Admitting: Family

## 2023-01-07 DIAGNOSIS — Z1231 Encounter for screening mammogram for malignant neoplasm of breast: Secondary | ICD-10-CM

## 2023-01-25 ENCOUNTER — Ambulatory Visit: Payer: 59

## 2023-01-29 ENCOUNTER — Ambulatory Visit
Admission: RE | Admit: 2023-01-29 | Discharge: 2023-01-29 | Disposition: A | Discharge status: Home / Self Care | Payer: 59 | Source: Ambulatory Visit | Attending: Family | Admitting: Family

## 2023-01-29 DIAGNOSIS — Z1231 Encounter for screening mammogram for malignant neoplasm of breast: Secondary | ICD-10-CM

## 2023-04-18 DIAGNOSIS — Z947 Corneal transplant status: Secondary | ICD-10-CM | POA: Diagnosis not present

## 2023-11-18 ENCOUNTER — Encounter: Payer: 59 | Admitting: Family

## 2023-11-29 ENCOUNTER — Ambulatory Visit (INDEPENDENT_AMBULATORY_CARE_PROVIDER_SITE_OTHER): Admitting: Family

## 2023-11-29 ENCOUNTER — Other Ambulatory Visit (HOSPITAL_COMMUNITY): Payer: Self-pay

## 2023-11-29 VITALS — BP 133/82 | HR 71 | Temp 98.0°F | Resp 16 | Ht 65.0 in | Wt 138.2 lb

## 2023-11-29 DIAGNOSIS — E785 Hyperlipidemia, unspecified: Secondary | ICD-10-CM | POA: Insufficient documentation

## 2023-11-29 DIAGNOSIS — B351 Tinea unguium: Secondary | ICD-10-CM | POA: Diagnosis not present

## 2023-11-29 DIAGNOSIS — Z0000 Encounter for general adult medical examination without abnormal findings: Secondary | ICD-10-CM | POA: Diagnosis not present

## 2023-11-29 DIAGNOSIS — M25552 Pain in left hip: Secondary | ICD-10-CM

## 2023-11-29 MED ORDER — TERBINAFINE HCL 250 MG PO TABS
250.0000 mg | ORAL_TABLET | Freq: Every day | ORAL | 0 refills | Status: DC
  Filled 2023-11-29: qty 30, 30d supply, fill #0

## 2023-11-29 NOTE — Assessment & Plan Note (Signed)
 New. Rx with lamisil , check LFT in 1 month.  Plan 12 week course.

## 2023-11-29 NOTE — Patient Instructions (Addendum)
 VISIT SUMMARY:  Today, you had your annual physical exam and discussed your left hip pain and toe fungus. We also reviewed your cholesterol levels and general health maintenance.  YOUR PLAN:  DEGENERATIVE JOINT DISEASE OF LEFT HIP: You have chronic left hip pain likely due to degenerative joint disease, which is affecting your exercise routine. -You will be referred to an orthopedic specialist for further evaluation of your left hip.  ONYCHOMYCOSIS OF LEFT GREAT TOE: You have a fungal infection on your left big toenail that has not responded to topical treatments. -You will start taking oral Lamisil  for 12 weeks. -We will check your liver function in one month to monitor for any side effects. -Please schedule a lab visit in one month to check your liver function.  HYPERCHOLESTEROLEMIA: Your cholesterol levels are high, but your overall cardiovascular risk is low. -We will reassess your cholesterol levels and cardiovascular risk next year.  ADULT WELLNESS VISIT: This was your annual wellness visit, and we discussed general health maintenance. -Schedule your next annual physical in one year. -We will discuss the pneumococcal vaccine at your next visit. -Plan to get your flu vaccination in the fall.

## 2023-11-29 NOTE — Assessment & Plan Note (Signed)
  Annual wellness visit with no acute concerns. Discussed general health maintenance, including vaccinations and screenings. - Schedule annual physical in one year. - She wishes to defer pneumococcal vaccine to next visit. - Plan for flu vaccination in the fall. - Pap/mammo and colo up to date.

## 2023-11-29 NOTE — Assessment & Plan Note (Signed)
 Lab Results  Component Value Date   CHOL 264 (H) 11/16/2022   HDL 80.40 11/16/2022   LDLCALC 171 (H) 11/16/2022   TRIG 61.0 11/16/2022   CHOLHDL 3 11/16/2022   Cholesterol is elevated but does not meet criteria for statin initiation. Continue to work on health diet and regular weight loss.

## 2023-11-29 NOTE — Assessment & Plan Note (Signed)
 Has hx of right hip replacement, now having pain in left hip. Refer to Ortho.

## 2023-11-29 NOTE — Progress Notes (Signed)
 Subjective:     Patient ID: Joanne Jones, female    DOB: Oct 31, 1960, 63 y.o.   MRN: [id number]  Chief Complaint  Patient presents with   Annual Exam    HPI  Discussed the use of AI scribe software for clinical note transcription with the patient, who gave verbal consent to proceed.  History of Present Illness  Joanne Jones is a 63 year old female who presents for an annual physical exam and evaluation of left hip pain and toe fungus.  She experiences left hip pain, which is notable given her right hip replacement in 2021. She remains active, using an elliptical trainer for 30 minutes four days a week and walking regularly. No other muscle or joint pains are present.  She has a fungal infection on her left great toenail, which is discolored. Topical antifungal medication has been ineffective. She tries to keep it painted because of the appearance of the nail.   Her past medical history includes bilateral corneal transplants, with regular ophthalmologist follow-ups. She consumes a glass of wine nightly and two to three glasses on weekends. She denies tobacco or drug use. No other systemic symptoms are reported.    Health Maintenance Due  Topic Date Due   Pneumococcal Vaccine: 19-49 Years (1 of 2 - PCV) Never done   Pneumococcal Vaccine: 50+ Years (1 of 2 - PCV) Never done   COVID-19 Vaccine (4 - 2024-25 season) 12/19/2022   INFLUENZA VACCINE  11/18/2023    Past Medical History:  Diagnosis Date   Abnormal Pap smear and cervical HPV (human papillomavirus)    cervical cyst   Astigmatism    S/P bialteral corneal transplants    Past Surgical History:  Procedure Laterality Date   AUGMENTATION MAMMAPLASTY Bilateral    BREAST ENHANCEMENT SURGERY  04/19/1992   corneal transp;lant  04/19/1982   left eye   CORNEAL TRANSPLANT  04/19/1986   right eye   removal of vulvar lipoma  04/19/1992   TOTAL HIP ARTHROPLASTY Right 12/28/2019   Procedure: RIGHT TOTAL HIP ARTHROPLASTY  ANTERIOR APPROACH;  Surgeon: Vernetta Lonni GRADE, MD;  Location: [facility];  Service: Orthopedics;  Laterality: Right;    Family History  Problem Relation Age of Onset   Cancer Maternal Grandmother        uterine   Supraventricular tachycardia Mother        living   Heart disease Father 17       hx of MV disease, LAD lesion, died of stroke complication of surgery   Keratoconus Brother    Hypertension Brother    Anxiety disorder Brother     Social History   Socioeconomic History   Marital status: Married    Spouse name: Not on file   Number of children: 2   Years of education: 18   Highest education level: Doctorate  Occupational History   Not on file  Tobacco Use   Smoking status: Never   Smokeless tobacco: Never  Vaping Use   Vaping status: Never Used  Substance and Sexual Activity   Alcohol use: Yes    Alcohol/week: 14.0 standard drinks of alcohol    Types: 14 Glasses of wine per week    Comment: GLASS OF WINE    Drug use: No   Sexual activity: Yes    Partners: Male  Other Topics Concern   Not on file  Social History Narrative   Works for [employer] Sales promotion account executive of [employer] and 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    Social Drivers of Corporate investment banker Strain: Low Risk  (11/29/2023)   Overall Financial Resource Strain (CARDIA)    Difficulty of Paying Living Expenses: Not hard at all  Food Insecurity: No Food Insecurity (11/29/2023)   Hunger Vital Sign    Worried About Running Out of Food in the Last Year: Never true    Ran Out of Food in the Last Year: Never true  Transportation Needs: No Transportation Needs (11/29/2023)   PRAPARE - Administrator, Civil Service (Medical): No    Lack of Transportation (Non-Medical): No  Physical Activity: Sufficiently Active (11/29/2023)   Exercise Vital Sign    Days of Exercise per Week: 6 days    Minutes of  Exercise per Session: 60 min  Stress: No Stress Concern Present (11/29/2023)   [employer] of Occupational Health - Occupational Stress Questionnaire    Feeling of Stress: Only a little  Social Connections: Socially Integrated (11/29/2023)   Social Connection and Isolation Panel    Frequency of Communication with Friends and Family: Three times a week    Frequency of Social Gatherings with Friends and Family: Once a week    Attends Religious Services: More than 4 times per year    Active Member of [employer] or Organizations: Yes    Attends Banker Meetings: 1 to 4 times per year    Marital Status: Married  Catering manager Violence: Not on file    Outpatient Medications Prior to Visit  Medication Sig Dispense Refill   Calcium  Carbonate-Vitamin D  600-400 MG-UNIT tablet Take 1 tablet by mouth 2 (two) times daily.     No facility-administered medications prior to visit.    No Known Allergies  Review of Systems  HENT:  Negative for congestion and hearing loss.   Eyes:  Negative for blurred vision.  Respiratory:  Negative for cough.   Cardiovascular:  Negative for leg swelling.  Gastrointestinal:  Negative for constipation and diarrhea.  Genitourinary:  Negative for dysuria and frequency.  Musculoskeletal:  Positive for joint pain (left hip). Negative for myalgias.  Skin:  Negative for rash.  Neurological:  Negative for headaches.  Psychiatric/Behavioral:  Negative for depression. The patient is not nervous/anxious.        Objective:    Physical Exam   BP 133/82 (BP Location: Right Arm, Patient Position: Sitting, Cuff Size: Small)   Pulse 71   Temp 98 F (36.7 C) (Oral)   Resp 16   Ht 5' 5 (1.651 m)   Wt 138 lb 3.2 oz (62.7 kg)   LMP 03/20/2015   SpO2 99%   BMI 23.00 kg/m  Wt Readings from Last 3 Encounters:  11/29/23 138 lb 3.2 oz (62.7 kg)  11/16/22 135 lb (61.2 kg)  11/03/21 137 lb (62.1 kg)   Physical Exam  Constitutional: She is oriented to  person, place, and time. She appears well-developed and well-nourished. No distress.  HENT:  Head: Normocephalic and atraumatic.  Right Ear: Tympanic membrane and ear canal normal.  Left Ear: Tympanic membrane and ear canal normal.  Mouth/Throat: Oropharynx is clear and moist.  Eyes: Pupils are equal, round, and reactive to light. No scleral icterus.  Neck: Normal range of motion. No thyromegaly present.  Cardiovascular: Normal rate and regular rhythm.   No murmur heard. Pulmonary/Chest: Effort normal and breath sounds  normal. No respiratory distress. He has no wheezes. She has no rales. She exhibits no tenderness.  Abdominal: Soft. Bowel sounds are normal. She exhibits no distension and no mass. There is no tenderness. There is no rebound and no guarding.  Musculoskeletal: She exhibits no edema.  Lymphadenopathy:    She has no cervical adenopathy.  Neurological: She is alert and oriented to person, place, and time. She has normal patellar reflexes. She exhibits normal muscle tone. Coordination normal.  Skin: Skin is warm and dry. Visible portion of painted left great toenail is discolored, toenail is thickened.  Psychiatric: She has a normal mood and affect. Her behavior is normal. Judgment and thought content normal.  Breast/pelvic: deferred           Assessment & Plan:       Assessment & Plan:   Problem List Items Addressed This Visit       Unprioritized   Preventative health care - Primary    Annual wellness visit with no acute concerns. Discussed general health maintenance, including vaccinations and screenings. - Schedule annual physical in one year. - She wishes to defer pneumococcal vaccine to next visit. - Plan for flu vaccination in the fall. - Pap/mammo and colo up to date.      Onychomycosis   New. Rx with lamisil , check LFT in 1 month.  Plan 12 week course.       Relevant Medications   terbinafine  (LAMISIL ) 250 MG tablet   Other Relevant Orders    Hepatic function panel   Left hip pain   Has hx of right hip replacement, now having pain in left hip. Refer to Ortho.      Relevant Orders   Ambulatory referral to Orthopedic Surgery   Hyperlipidemia   Lab Results  Component Value Date   CHOL 264 (H) 11/16/2022   HDL 80.40 11/16/2022   LDLCALC 171 (H) 11/16/2022   TRIG 61.0 11/16/2022   CHOLHDL 3 11/16/2022   Cholesterol is elevated but does not meet criteria for statin initiation. Continue to work on health diet and regular weight loss.        I am having Terriah A. Grade start on terbinafine . I am also having her maintain her Calcium  Carbonate-Vitamin D .  Meds ordered this encounter  Medications   terbinafine  (LAMISIL ) 250 MG tablet    Sig: Take 1 tablet (250 mg total) by mouth daily.    Dispense:  30 tablet    Refill:  0    Supervising Provider:   DOMENICA BLACKBIRD A [[id number]]

## 2023-11-30 ENCOUNTER — Other Ambulatory Visit (HOSPITAL_COMMUNITY): Payer: Self-pay

## 2023-12-28 ENCOUNTER — Other Ambulatory Visit (INDEPENDENT_AMBULATORY_CARE_PROVIDER_SITE_OTHER): Payer: Self-pay

## 2023-12-28 ENCOUNTER — Ambulatory Visit: Admitting: Orthopaedic Surgery

## 2023-12-28 DIAGNOSIS — M25552 Pain in left hip: Secondary | ICD-10-CM

## 2023-12-28 MED ORDER — METHYLPREDNISOLONE ACETATE 40 MG/ML IJ SUSP
40.0000 mg | INTRAMUSCULAR | Status: AC | PRN
  Administered 2023-12-28: 40 mg via INTRA_ARTICULAR

## 2023-12-28 MED ORDER — LIDOCAINE HCL 1 % IJ SOLN
3.0000 mL | INTRAMUSCULAR | Status: AC | PRN
  Administered 2023-12-28: 3 mL

## 2023-12-28 NOTE — Progress Notes (Signed)
 The patient is well-known to us .  She is a 63 year old female who is very active and we replaced her right hip back in September 2021.  Her x-rays of her right hip were normal at that time but the MRI of her right hip showed extensive subchondral edema in the femoral head and neck with degenerative labral tearing and thinning of the cartilage.  Her left hip at that time was normal-appearing on MRI and I did pull it up today to confirm.  For the last few months she has had off-and-on pain on the lateral aspect of her left hip.  She denies any groin pain.  She says it is starting to feel like it did when she ended up having right hip issues.  She exercises regularly.  When I have her lay in a supine position her leg lengths are equal.  Her right hip moves smoothly.  Her left hip moves smoothly with no pain in the groin and no blocks rotation at all.  When I have her lay on her side with her left hip up there is pain to palpation right around the trochanteric area of her hip.  An AP pelvis and lateral of the left hip were reviewed today and I do not see any gross findings of arthritic changes in the left hip itself.  There is a small osteophyte off of the tip of the trochanteric area and slight flattening of the femoral head.  Her signs and symptoms were consistent with trochanteric bursitis.  I did recommend a steroid injection in her left hip trochanteric area which she agreed to and tolerated well.  Also showed her stretching exercises to try and have recommended topical Voltaren gel which she does have at home.  We will then see her back in 4 weeks to see how she is doing overall.  All question concerns were addressed and answered.    Procedure Note  Patient: Joanne Jones             Date of Birth: 20-Jul-1960           MRN: [id number]             Visit Date: 12/28/2023  Procedures: Visit Diagnoses:  1. Pain of left hip     Large Joint Inj: L greater trochanter on 12/28/2023 9:17 AM Indications:  pain and diagnostic evaluation Details: 22 G 1.5 in needle, lateral approach  Arthrogram: No  Medications: 3 mL lidocaine  1 %; 40 mg methylPREDNISolone  acetate 40 MG/ML Outcome: tolerated well, no immediate complications Procedure, treatment alternatives, risks and benefits explained, specific risks discussed. Consent was given by the patient. Immediately prior to procedure a time out was called to verify the correct patient, procedure, equipment, support staff and site/side marked as required. Patient was prepped and draped in the usual sterile fashion.

## 2023-12-30 ENCOUNTER — Other Ambulatory Visit (INDEPENDENT_AMBULATORY_CARE_PROVIDER_SITE_OTHER)

## 2023-12-30 ENCOUNTER — Other Ambulatory Visit (HOSPITAL_COMMUNITY): Payer: Self-pay

## 2023-12-30 ENCOUNTER — Other Ambulatory Visit: Payer: Self-pay | Admitting: Family

## 2023-12-30 ENCOUNTER — Other Ambulatory Visit (HOSPITAL_BASED_OUTPATIENT_CLINIC_OR_DEPARTMENT_OTHER): Payer: Self-pay

## 2023-12-30 ENCOUNTER — Ambulatory Visit: Payer: Self-pay | Admitting: Family

## 2023-12-30 DIAGNOSIS — B351 Tinea unguium: Secondary | ICD-10-CM

## 2023-12-30 LAB — HEPATIC FUNCTION PANEL
ALT: 13 U/L (ref 0–35)
AST: 22 U/L (ref 0–37)
Albumin: 4.6 g/dL (ref 3.5–5.2)
Alkaline Phosphatase: 53 U/L (ref 39–117)
Bilirubin, Direct: 0.1 mg/dL (ref 0.0–0.3)
Total Bilirubin: 0.5 mg/dL (ref 0.2–1.2)
Total Protein: 6.7 g/dL (ref 6.0–8.3)

## 2023-12-30 MED ORDER — TERBINAFINE HCL 250 MG PO TABS
250.0000 mg | ORAL_TABLET | Freq: Every day | ORAL | 0 refills | Status: AC
  Filled 2023-12-30: qty 28, 28d supply, fill #0
  Filled 2023-12-30: qty 32, 32d supply, fill #0
  Filled 2024-01-25: qty 32, 32d supply, fill #1

## 2023-12-31 ENCOUNTER — Other Ambulatory Visit (HOSPITAL_COMMUNITY): Payer: Self-pay

## 2024-01-02 ENCOUNTER — Other Ambulatory Visit: Payer: Self-pay

## 2024-01-11 ENCOUNTER — Other Ambulatory Visit (HOSPITAL_COMMUNITY): Payer: Self-pay

## 2024-01-25 ENCOUNTER — Ambulatory Visit: Admitting: Orthopaedic Surgery

## 2024-01-25 ENCOUNTER — Other Ambulatory Visit (HOSPITAL_COMMUNITY): Payer: Self-pay

## 2024-01-25 ENCOUNTER — Encounter: Payer: Self-pay | Admitting: Orthopaedic Surgery

## 2024-01-25 DIAGNOSIS — M25552 Pain in left hip: Secondary | ICD-10-CM

## 2024-01-25 NOTE — Progress Notes (Signed)
 The patient comes in today for follow-up as a relates to left hip tendinitis and bursitis.  At her last visit I did place a steroid injection on the trochanteric area and she said she is not 100% better but she is much better than she was before the injection.  She has been on a big [place] trip with no issues.  On exam her left hip moves smoothly and fluidly.  There is no significant severe pain but just a little bit of pain over the trochanteric area.  We have replaced her right hip in the past.  At this point since she is doing so much better follow-up can be as needed.  If it does flareup she knows to reach out to us .

## 2024-01-30 ENCOUNTER — Other Ambulatory Visit (HOSPITAL_COMMUNITY): Payer: Self-pay

## 2024-02-06 DIAGNOSIS — H2513 Age-related nuclear cataract, bilateral: Secondary | ICD-10-CM | POA: Diagnosis not present

## 2024-02-06 DIAGNOSIS — Z947 Corneal transplant status: Secondary | ICD-10-CM | POA: Diagnosis not present

## 2024-02-06 DIAGNOSIS — H25043 Posterior subcapsular polar age-related cataract, bilateral: Secondary | ICD-10-CM | POA: Diagnosis not present

## 2024-02-06 DIAGNOSIS — H40013 Open angle with borderline findings, low risk, bilateral: Secondary | ICD-10-CM | POA: Diagnosis not present

## 2024-02-20 ENCOUNTER — Encounter: Payer: Self-pay | Admitting: Radiology

## 2024-03-13 DIAGNOSIS — H25813 Combined forms of age-related cataract, bilateral: Secondary | ICD-10-CM | POA: Diagnosis not present

## 2024-03-13 DIAGNOSIS — H18613 Keratoconus, stable, bilateral: Secondary | ICD-10-CM | POA: Diagnosis not present

## 2024-03-27 ENCOUNTER — Other Ambulatory Visit (HOSPITAL_COMMUNITY): Payer: Self-pay

## 2024-03-27 MED ORDER — OFLOXACIN 0.3 % OP SOLN
1.0000 [drp] | Freq: Four times a day (QID) | OPHTHALMIC | 0 refills | Status: AC
  Filled 2024-03-27 – 2024-05-03 (×2): qty 5, 25d supply, fill #0

## 2024-03-28 ENCOUNTER — Other Ambulatory Visit (HOSPITAL_COMMUNITY): Payer: Self-pay

## 2024-04-07 ENCOUNTER — Other Ambulatory Visit (HOSPITAL_COMMUNITY): Payer: Self-pay

## 2024-05-03 ENCOUNTER — Other Ambulatory Visit (HOSPITAL_COMMUNITY): Payer: Self-pay

## 2024-11-30 ENCOUNTER — Encounter: Admitting: Family
